# Patient Record
Sex: Female | Born: 1985 | Race: White | Hispanic: No | Marital: Married | Smoking: Never smoker
Health system: Southern US, Community
[De-identification: ages and names within clinical notes are randomized; demographics above are authoritative.]

## PROBLEM LIST (undated history)

## (undated) DIAGNOSIS — F329 Major depressive disorder, single episode, unspecified: Secondary | ICD-10-CM

## (undated) DIAGNOSIS — L709 Acne, unspecified: Secondary | ICD-10-CM

## (undated) DIAGNOSIS — K219 Gastro-esophageal reflux disease without esophagitis: Secondary | ICD-10-CM

## (undated) DIAGNOSIS — F419 Anxiety disorder, unspecified: Secondary | ICD-10-CM

## (undated) DIAGNOSIS — R87629 Unspecified abnormal cytological findings in specimens from vagina: Secondary | ICD-10-CM

## (undated) DIAGNOSIS — A749 Chlamydial infection, unspecified: Secondary | ICD-10-CM

## (undated) DIAGNOSIS — F32A Depression, unspecified: Secondary | ICD-10-CM

## (undated) HISTORY — DX: Chlamydial infection, unspecified: A74.9

## (undated) HISTORY — DX: Acne, unspecified: L70.9

## (undated) HISTORY — DX: Major depressive disorder, single episode, unspecified: F32.9

## (undated) HISTORY — PX: COLPOSCOPY: SHX161

## (undated) HISTORY — DX: Depression, unspecified: F32.A

## (undated) HISTORY — DX: Unspecified abnormal cytological findings in specimens from vagina: R87.629

## (undated) HISTORY — DX: Gastro-esophageal reflux disease without esophagitis: K21.9

## (undated) HISTORY — DX: Anxiety disorder, unspecified: F41.9

## (undated) HISTORY — PX: LEEP: SHX91

---
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
## 2012-12-02 ENCOUNTER — Other Ambulatory Visit: Payer: Self-pay | Admitting: Nurse Practitioner

## 2013-01-10 ENCOUNTER — Other Ambulatory Visit: Payer: Self-pay | Admitting: Nurse Practitioner

## 2013-01-13 ENCOUNTER — Other Ambulatory Visit: Payer: Self-pay | Admitting: Nurse Practitioner

## 2013-01-23 ENCOUNTER — Encounter: Payer: Self-pay | Admitting: *Deleted

## 2013-01-28 ENCOUNTER — Ambulatory Visit (INDEPENDENT_AMBULATORY_CARE_PROVIDER_SITE_OTHER): Payer: 59 | Admitting: Nurse Practitioner

## 2013-01-28 ENCOUNTER — Encounter: Payer: Self-pay | Admitting: Nurse Practitioner

## 2013-01-28 VITALS — BP 130/90 | HR 80 | Ht 58.5 in | Wt 201.0 lb

## 2013-01-28 DIAGNOSIS — F418 Other specified anxiety disorders: Secondary | ICD-10-CM

## 2013-01-28 DIAGNOSIS — Z0000 Encounter for general adult medical examination without abnormal findings: Secondary | ICD-10-CM

## 2013-01-28 DIAGNOSIS — Z01419 Encounter for gynecological examination (general) (routine) without abnormal findings: Secondary | ICD-10-CM

## 2013-01-28 DIAGNOSIS — F341 Dysthymic disorder: Secondary | ICD-10-CM

## 2013-01-28 MED ORDER — VENLAFAXINE HCL ER 150 MG PO CP24: 150.0000 mg | ORAL_CAPSULE | Freq: Every day | ORAL | Status: DC

## 2013-01-28 MED ORDER — OMEPRAZOLE 20 MG PO CPDR: 20.0000 mg | DELAYED_RELEASE_CAPSULE | Freq: Every day | ORAL | Status: AC

## 2013-01-28 NOTE — Progress Notes (Signed)
  Subjective:    Patient ID: Kerry Ryan, female    DOB: 1985-11-16, 27 y.o.   MRN: [id number]  HPI presents for her wellness checkup. Is off her doxycycline. Has been off birth control since December. No regular eye exams. Does get regular dental exams. Has switched to a vegetarian diet but notes that her diet has increased in fat and carbs. Has a very active job. Is considering pregnancy, questioning whether she can continue Effexor. Has been with the same sexual partner since her last physical.   Review of Systems  Constitutional: Negative for activity change, appetite change and fatigue.  HENT: Negative for ear pain, congestion, sore throat, rhinorrhea and dental problem.   Eyes: Negative for visual disturbance.  Respiratory: Negative for chest tightness, shortness of breath and wheezing.   Cardiovascular: Negative for chest pain.  Gastrointestinal: Negative for nausea, vomiting, abdominal pain, diarrhea, constipation and abdominal distention.  Genitourinary: Negative for dysuria, urgency, frequency, vaginal discharge, difficulty urinating, menstrual problem and pelvic pain.  Neurological: Negative for headaches.  Psychiatric/Behavioral: Positive for sleep disturbance. The patient is not nervous/anxious.        Objective:   Physical Exam  Vitals reviewed. Constitutional: She is oriented to person, place, and time. She appears well-developed. No distress.  HENT:  Right Ear: External ear normal.  Left Ear: External ear normal.  Mouth/Throat: Oropharynx is clear and moist.  Eyes: Conjunctivae and EOM are normal. Pupils are equal, round, and reactive to light.  Neck: Normal range of motion. Neck supple. No tracheal deviation present. No thyromegaly present.  Cardiovascular: Normal rate, regular rhythm and normal heart sounds.  Exam reveals no gallop.   No murmur heard. Pulmonary/Chest: Effort normal and breath sounds normal.  Abdominal: Soft. She exhibits no distension. There is  no tenderness.  Genitourinary: Vagina normal and uterus normal. No vaginal discharge found.  Musculoskeletal: She exhibits no edema.  Lymphadenopathy:    She has no cervical adenopathy.  Neurological: She is alert and oriented to person, place, and time.  Skin: Skin is warm and dry. No rash noted.  Psychiatric: She has a normal mood and affect. Her behavior is normal.   Breast exam no masses noted. Axilla no adenopathy. External GU normal. Vagina no discharge. No CMT. Bimanual exam normal limit. GC and Chlamydia on 09/19/12 was negative. Skin a few erythematous lesions on the face.        Assessment & Plan:  Well woman exam  Encouraged weight loss of about 20 pounds. Regular activity. Healthy diet. Daily multivitamin. An e-mail was sent to Cyril Mourning at [facility] regarding use of Effexor during pregnancy. Definitely stop doxycycline if she plans to become pregnant. Next physical in one year.

## 2013-01-29 ENCOUNTER — Encounter: Payer: Self-pay | Admitting: Nurse Practitioner

## 2013-01-29 DIAGNOSIS — F418 Other specified anxiety disorders: Secondary | ICD-10-CM | POA: Insufficient documentation

## 2013-01-29 NOTE — Assessment & Plan Note (Signed)
Continue Effexor as directed. Will consult with gynecology regarding use of Effexor with pregnancy.

## 2013-01-29 NOTE — Assessment & Plan Note (Signed)
Weight loss goal is 20 pounds or 10% of her body weight. Discussed programs available to help her with this.

## 2013-06-02 ENCOUNTER — Ambulatory Visit (HOSPITAL_COMMUNITY)
Admission: RE | Admit: 2013-06-02 | Discharge: 2013-06-02 | Disposition: A | Discharge status: Home / Self Care | Payer: 59 | Source: Ambulatory Visit | Attending: Nurse Practitioner | Admitting: Nurse Practitioner

## 2013-06-02 ENCOUNTER — Ambulatory Visit (INDEPENDENT_AMBULATORY_CARE_PROVIDER_SITE_OTHER): Payer: 59 | Admitting: Nurse Practitioner

## 2013-06-02 ENCOUNTER — Encounter: Payer: Self-pay | Admitting: Nurse Practitioner

## 2013-06-02 VITALS — BP 124/90 | Ht 58.5 in | Wt 199.1 lb

## 2013-06-02 DIAGNOSIS — M25569 Pain in unspecified knee: Secondary | ICD-10-CM | POA: Insufficient documentation

## 2013-06-02 DIAGNOSIS — M25562 Pain in left knee: Secondary | ICD-10-CM

## 2013-06-02 DIAGNOSIS — X58XXXA Exposure to other specified factors, initial encounter: Secondary | ICD-10-CM | POA: Insufficient documentation

## 2013-06-02 DIAGNOSIS — I998 Other disorder of circulatory system: Secondary | ICD-10-CM

## 2013-06-02 DIAGNOSIS — R58 Hemorrhage, not elsewhere classified: Secondary | ICD-10-CM

## 2013-06-02 MED ORDER — VENLAFAXINE HCL ER 150 MG PO CP24: 150.0000 mg | ORAL_CAPSULE | Freq: Every day | ORAL | Status: DC

## 2013-06-02 MED ORDER — ALPRAZOLAM 0.5 MG PO TABS: ORAL_TABLET | ORAL | Status: DC

## 2013-06-05 ENCOUNTER — Other Ambulatory Visit: Payer: Self-pay

## 2013-06-06 ENCOUNTER — Encounter: Payer: Self-pay | Admitting: Nurse Practitioner
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
## 2013-06-06 NOTE — Assessment & Plan Note (Signed)
Restart Effexor as directed. Given Xanax for breakthrough severe anxiety. Recheck in 3 months.

## 2013-06-11 ENCOUNTER — Telehealth: Payer: Self-pay | Admitting: Family Medicine
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
## 2013-06-13 NOTE — Telephone Encounter (Signed)
Letter was done and given to [facility]

## 2013-07-07 ENCOUNTER — Encounter: Payer: Self-pay | Admitting: Family Medicine

## 2013-07-07 ENCOUNTER — Ambulatory Visit (INDEPENDENT_AMBULATORY_CARE_PROVIDER_SITE_OTHER): Payer: 59 | Admitting: Family Medicine

## 2013-07-07 VITALS — BP 128/90 | Temp 97.7°F | Ht 59.5 in | Wt 203.0 lb

## 2013-07-07 DIAGNOSIS — A088 Other specified intestinal infections: Secondary | ICD-10-CM

## 2013-07-07 DIAGNOSIS — A084 Viral intestinal infection, unspecified: Secondary | ICD-10-CM

## 2013-07-07 MED ORDER — ONDANSETRON HCL 8 MG PO TABS: 8.0000 mg | ORAL_TABLET | Freq: Three times a day (TID) | ORAL | Status: DC | PRN

## 2013-07-07 NOTE — Progress Notes (Signed)
   Subjective:    Patient ID: Kerry Ryan, female    DOB: 01/21/1986, 27 y.o.   MRN: [id number]  Abdominal Pain This is a new problem. The current episode started in the past 7 days. Associated symptoms include diarrhea, nausea and vomiting. Associated symptoms comments: dizziness. Treatments tried: OTC nausea med. The treatment provided mild relief.   Patient relates a few days of abdominal cramps discomfort loose stools slight nausea occasional vomiting denies wheezing difficulty breathing cough denies dysuria hematuria or hematochezia. No recent travel.  PMH benign Review of Systems  Gastrointestinal: Positive for nausea, vomiting, abdominal pain and diarrhea.       Objective:   Physical Exam Lungs are clear hearts regular abdomen soft no guarding or rebound extremities no edema skin warm dry       Assessment & Plan:  Viral gastroenteritis Zofran as necessary for nausea warning signs discussed a bloody stools or worse followup

## 2013-07-16 ENCOUNTER — Ambulatory Visit (INDEPENDENT_AMBULATORY_CARE_PROVIDER_SITE_OTHER): Payer: 59 | Admitting: Nurse Practitioner

## 2013-07-16 ENCOUNTER — Encounter: Payer: Self-pay | Admitting: Nurse Practitioner

## 2013-07-16 VITALS — BP 122/82 | Ht <= 58 in | Wt 206.0 lb

## 2013-07-16 DIAGNOSIS — Z7251 High risk heterosexual behavior: Secondary | ICD-10-CM

## 2013-07-16 DIAGNOSIS — Z3009 Encounter for other general counseling and advice on contraception: Secondary | ICD-10-CM

## 2013-07-16 MED ORDER — LEVONORGEST-ETH ESTRAD 91-DAY 0.15-0.03 &0.01 MG PO TABS: 1.0000 | ORAL_TABLET | Freq: Every day | ORAL | Status: DC

## 2013-07-19 ENCOUNTER — Encounter: Payer: Self-pay | Admitting: Nurse Practitioner

## 2013-07-19 LAB — GC/CHLAMYDIA PROBE AMP, URINE: GC Probe Amp, Urine: NEGATIVE

## 2013-07-19 NOTE — Progress Notes (Signed)
Subjective:  Presents to discuss restarting her birth control pills. Has had irregular bleeding and spotting since her miscarriage a few months ago. No vaginal discharge fever or pelvic pain. Has not had gonorrhea or Chlamydia testing of the past few months with her previous sexual partner.  Objective:   BP 122/82  Ht 4\' 10"  (1.473 m)  Wt 206 lb (93.441 kg)  BMI 43.07 kg/m2 NAD. Alert, oriented. Lungs clear. Heart regular rate rhythm.  Assessment:Other general counseling and advice for contraceptive management  High risk sexual behavior - Plan: GC/chlamydia probe amp, urine  Plan: Meds ordered this encounter  Medications  . Levonorgestrel-Ethinyl Estradiol (AMETHIA,CAMRESE) 0.15-0.03 &0.01 MG tablet    Sig: Take 1 tablet by mouth daily.    Dispense:  1 Package    Refill:  4    Order Specific Question:  Supervising Provider    Answer:  Riccardo Dubin   Reviewed safe sex issues. Start birth control pills first Sunday after her next cycle begins. Use backup method first pack. Call back if any problems.

## 2013-07-22 NOTE — Progress Notes (Signed)
Patient notified

## 2013-09-12 ENCOUNTER — Encounter: Payer: Self-pay | Admitting: Nurse Practitioner

## 2013-09-12 ENCOUNTER — Ambulatory Visit (INDEPENDENT_AMBULATORY_CARE_PROVIDER_SITE_OTHER): Payer: 59 | Admitting: Nurse Practitioner

## 2013-09-12 VITALS — BP 136/96 | Ht 59.5 in | Wt 200.0 lb

## 2013-09-12 DIAGNOSIS — N949 Unspecified condition associated with female genital organs and menstrual cycle: Secondary | ICD-10-CM

## 2013-09-12 DIAGNOSIS — N938 Other specified abnormal uterine and vaginal bleeding: Secondary | ICD-10-CM

## 2013-09-12 DIAGNOSIS — N925 Other specified irregular menstruation: Secondary | ICD-10-CM

## 2013-09-12 MED ORDER — PHENTERMINE HCL 37.5 MG PO TABS: 37.5000 mg | ORAL_TABLET | Freq: Every day | ORAL | Status: DC

## 2013-09-12 MED ORDER — DROSPIRENONE-ETHINYL ESTRADIOL 3-0.03 MG PO TABS: 1.0000 | ORAL_TABLET | Freq: Every day | ORAL | Status: DC

## 2013-09-12 NOTE — Patient Instructions (Signed)
cetaphil cleanser

## 2013-09-18 ENCOUNTER — Encounter: Payer: Self-pay | Admitting: Nurse Practitioner

## 2013-09-18 NOTE — Progress Notes (Signed)
Subjective:  Presents for complaints of breakthrough bleeding for the past 2 weeks on her current birth control pill. Light flow. Off-and-on bleeding. Currently on week 8 of her pack. Would also like to restart her phentermine for weight loss, has been off of this for a long time. Stress level has improved, is now back in her own home. Is not currently sexually active. No new partners.  Objective:   BP 136/96  Ht 4' 11.5" (1.511 m)  Wt 200 lb (90.719 kg)  BMI 39.73 kg/m2 NAD. Alert, oriented. Cheerful affect. Lungs clear. Heart regular rate rhythm. No murmur or gallop noted. BP on recheck right arm sitting 136/96.  Assessment: Problem List Items Addressed This Visit     Other   Morbid obesity (Chronic)   Relevant Medications      phentermine (ADIPEX-P) 37.5 MG tablet    Other Visit Diagnoses   DUB (dysfunctional uterine bleeding)    -  Primary      Plan:  Meds ordered this encounter  Medications  . drospirenone-ethinyl estradiol (YASMIN,ZARAH,SYEDA) 3-0.03 MG tablet    Sig: Take 1 tablet by mouth daily.    Dispense:  1 Package    Refill:  11    Order Specific Question:  Supervising Provider    Answer:  Merlyn AlbertLUKING, WILLIAM S [[id number]]  . phentermine (ADIPEX-P) 37.5 MG tablet    Sig: Take 1 tablet (37.5 mg total) by mouth daily before breakfast.    Dispense:  30 tablet    Refill:  2    Order Specific Question:  Supervising Provider    Answer:  Merlyn AlbertLUKING, WILLIAM S [[id number]]   Recheck BP outside office which she has restarted her phentermine. DC med and call if it remains above 140/90. Start Yasmin this coming Sunday. Call back after one week if bleeding persists. Return in about 3 months (around 12/10/2013).

## 2013-10-22 ENCOUNTER — Ambulatory Visit (INDEPENDENT_AMBULATORY_CARE_PROVIDER_SITE_OTHER): Payer: 59 | Admitting: Nurse Practitioner

## 2013-10-22 ENCOUNTER — Encounter: Payer: Self-pay | Admitting: Nurse Practitioner

## 2013-10-22 VITALS — BP 132/92 | Ht 59.5 in | Wt 195.0 lb

## 2013-10-22 DIAGNOSIS — J309 Allergic rhinitis, unspecified: Secondary | ICD-10-CM

## 2013-10-22 DIAGNOSIS — J300 Vasomotor rhinitis: Secondary | ICD-10-CM

## 2013-10-22 NOTE — Patient Instructions (Signed)
OTC antihistamine Nasacort AQ as directed 

## 2013-10-26 ENCOUNTER — Encounter: Payer: Self-pay | Admitting: Nurse Practitioner

## 2013-10-26 NOTE — Progress Notes (Signed)
Subjective:  Presents with complaints of sinus congestion over the past 2 days. No fever. Nonproductive cough. Runny nose. No wheezing. Off-and-on maxillary area pressure. Dry irritated throat. Slight ear pain mainly in the morning.  Objective:   BP 132/92  Ht 4' 11.5" (1.511 m)  Wt 195 lb (88.451 kg)  BMI 38.74 kg/m2 NAD. Alert, oriented. TMs clear effusion, no erythema. Nasal mucosa pale and boggy. Pharynx injected. Neck supple with mild soft anterior adenopathy. Lungs clear. Heart regular rate rhythm.  Assessment:Vasomotor rhinitis  Plan: OTC antihistamines and Nasacort AQ as directed. Warning signs reviewed. Call back if worsens or persists.

## 2013-11-18 ENCOUNTER — Other Ambulatory Visit: Payer: Self-pay | Admitting: Nurse Practitioner

## 2013-11-21 ENCOUNTER — Other Ambulatory Visit: Payer: Self-pay | Admitting: *Deleted

## 2013-11-21 MED ORDER — ALPRAZOLAM 0.5 MG PO TABS: ORAL_TABLET | ORAL | Status: DC

## 2013-12-16 ENCOUNTER — Other Ambulatory Visit: Payer: Self-pay | Admitting: Nurse Practitioner

## 2014-01-27 ENCOUNTER — Ambulatory Visit: Payer: 59 | Admitting: Nurse Practitioner

## 2014-01-28 ENCOUNTER — Other Ambulatory Visit: Payer: Self-pay | Admitting: Nurse Practitioner

## 2014-01-28 NOTE — Telephone Encounter (Signed)
Seen 3/25

## 2014-02-09 ENCOUNTER — Ambulatory Visit (INDEPENDENT_AMBULATORY_CARE_PROVIDER_SITE_OTHER): Payer: 59 | Admitting: Nurse Practitioner

## 2014-02-09 ENCOUNTER — Encounter: Payer: Self-pay | Admitting: Nurse Practitioner

## 2014-02-09 DIAGNOSIS — L909 Atrophic disorder of skin, unspecified: Secondary | ICD-10-CM

## 2014-02-09 DIAGNOSIS — L918 Other hypertrophic disorders of the skin: Secondary | ICD-10-CM

## 2014-02-09 DIAGNOSIS — L919 Hypertrophic disorder of the skin, unspecified: Secondary | ICD-10-CM

## 2014-02-09 MED ORDER — PHENTERMINE HCL 37.5 MG PO TABS: 37.5000 mg | ORAL_TABLET | Freq: Every day | ORAL | Status: DC

## 2014-02-09 NOTE — Patient Instructions (Addendum)
[place]  Vertical sleeve gastrectomy L tryptophan

## 2014-02-12 ENCOUNTER — Encounter: Payer: Self-pay | Admitting: Nurse Practitioner

## 2014-02-12 NOTE — Progress Notes (Signed)
Subjective:  Presents for followup on weight loss medicine. Doing better with her exercise activity. Continues to seek psychiatrist. Depression and anxiety are improving. Also has a skin tag on the left side of her neck that is been there for a while. Slight color change. No bleeding.  Objective:   BP 130/90  Ht 4' 11.5" (1.511 m)  Wt 192 lb (87.091 kg)  BMI 38.15 kg/m2 NAD. Alert, oriented. Lungs clear. Heart regular rhythm. A small dark skin tag noted on the left neck area.  Assessment: Morbid obesity  Skin tag  Plan:  Meds ordered this encounter  Medications  . phentermine (ADIPEX-P) 37.5 MG tablet    Sig: Take 1 tablet (37.5 mg total) by mouth daily before breakfast.    Dispense:  30 tablet    Refill:  2    Order Specific Question:  Supervising Provider    Answer:  Merlyn AlbertLUKING, WILLIAM S [[id number]]   Continue healthy diet exercise and weight loss efforts. Discussed other options including bariatric surgery. Recommend office visit for skin tag removal.

## 2014-02-26 ENCOUNTER — Encounter: Payer: Self-pay | Admitting: Nurse Practitioner

## 2014-02-26 ENCOUNTER — Ambulatory Visit (INDEPENDENT_AMBULATORY_CARE_PROVIDER_SITE_OTHER): Payer: 59 | Admitting: Nurse Practitioner

## 2014-02-26 VITALS — BP 124/88 | Ht 59.5 in | Wt 191.0 lb

## 2014-02-26 DIAGNOSIS — L919 Hypertrophic disorder of the skin, unspecified: Secondary | ICD-10-CM

## 2014-02-26 DIAGNOSIS — L909 Atrophic disorder of skin, unspecified: Secondary | ICD-10-CM

## 2014-02-26 DIAGNOSIS — L918 Other hypertrophic disorders of the skin: Secondary | ICD-10-CM

## 2014-03-03 ENCOUNTER — Encounter: Payer: Self-pay | Admitting: Nurse Practitioner

## 2014-03-03 NOTE — Progress Notes (Signed)
Subjective:  Presents for removal of skin tag on the left side of her neck.  Objective:   BP 124/88  Ht 4' 11.5" (1.511 m)  Wt 191 lb (86.637 kg)  BMI 37.95 kg/m2 Area at base of skin tag injected with a small amount of lidocaine with epi. Skin tag removed without difficulty. Minimal bleeding.  Assessment:Skin tag  Plan: Keep area clean and dry. Expect gradual healing. Call back if any problems.

## 2014-03-05 ENCOUNTER — Other Ambulatory Visit: Payer: Self-pay | Admitting: *Deleted

## 2014-03-05 MED ORDER — DROSPIRENONE-ETHINYL ESTRADIOL 3-0.03 MG PO TABS: 1.0000 | ORAL_TABLET | Freq: Every day | ORAL | Status: DC

## 2014-03-05 MED ORDER — VENLAFAXINE HCL ER 150 MG PO CP24: ORAL_CAPSULE | ORAL | Status: DC

## 2014-04-22 ENCOUNTER — Other Ambulatory Visit: Payer: Self-pay | Admitting: Family Medicine

## 2014-04-22 NOTE — Telephone Encounter (Signed)
May refill this +3 additional refills 

## 2014-09-22 ENCOUNTER — Other Ambulatory Visit: Payer: Self-pay | Admitting: Nurse Practitioner

## 2014-10-19 ENCOUNTER — Other Ambulatory Visit: Payer: Self-pay | Admitting: Family Medicine

## 2015-01-11 ENCOUNTER — Other Ambulatory Visit: Payer: Self-pay | Admitting: Nurse Practitioner

## 2015-01-11 NOTE — Telephone Encounter (Signed)
Needs office visit.

## 2015-02-08 ENCOUNTER — Other Ambulatory Visit: Payer: Self-pay | Admitting: Nurse Practitioner

## 2015-02-08 NOTE — Telephone Encounter (Signed)
Needs office visit.

## 2015-03-08 ENCOUNTER — Other Ambulatory Visit: Payer: Self-pay | Admitting: Nurse Practitioner

## 2015-06-07 ENCOUNTER — Encounter: Payer: Self-pay | Admitting: Nurse Practitioner

## 2015-06-15 ENCOUNTER — Other Ambulatory Visit (HOSPITAL_COMMUNITY)
Admission: RE | Admit: 2015-06-15 | Discharge: 2015-06-15 | Disposition: A | Discharge status: Home / Self Care | Payer: Self-pay | Source: Ambulatory Visit | Attending: Unknown Physician Specialty | Admitting: Unknown Physician Specialty

## 2015-06-15 DIAGNOSIS — R87619 Unspecified abnormal cytological findings in specimens from cervix uteri: Secondary | ICD-10-CM | POA: Insufficient documentation

## 2015-08-09 ENCOUNTER — Encounter: Payer: Self-pay | Admitting: *Deleted

## 2015-09-08 ENCOUNTER — Encounter: Payer: Self-pay | Admitting: Obstetrics and Gynecology

## 2015-09-08 ENCOUNTER — Ambulatory Visit (INDEPENDENT_AMBULATORY_CARE_PROVIDER_SITE_OTHER): Payer: BLUE CROSS/BLUE SHIELD | Admitting: Obstetrics and Gynecology

## 2015-09-08 ENCOUNTER — Other Ambulatory Visit: Payer: Self-pay | Admitting: Obstetrics and Gynecology

## 2015-09-08 VITALS — BP 140/100 | HR 78 | Ht <= 58 in | Wt 231.0 lb

## 2015-09-08 DIAGNOSIS — Z3202 Encounter for pregnancy test, result negative: Secondary | ICD-10-CM | POA: Diagnosis not present

## 2015-09-08 DIAGNOSIS — N871 Moderate cervical dysplasia: Secondary | ICD-10-CM

## 2015-09-08 LAB — POCT URINE PREGNANCY: PREG TEST UR: NEGATIVE

## 2015-09-08 MED ORDER — METRONIDAZOLE 0.75 % VA GEL: 1.0000 | Freq: Every day | VAGINAL | Status: DC

## 2015-09-08 NOTE — Addendum Note (Signed)
Addended by: Tilda Burrow on: 09/08/2015 01:09 PM   Modules accepted: Orders

## 2015-09-08 NOTE — Progress Notes (Signed)
Patient ID: Kerry Ryan, female   DOB: 1986/03/14, 30 y.o.   MRN: [id number] [facility] PROCEDURE NOTE:  LEEP Kerry Ryan is a 30 y.o. No obstetric history on file. here for LEEP. No GYN concerns. Pap smear and colposcopy reviewed.  Pt interviewed, is on OCP, has neg preg test. Rationale for procedure reviewed. With pt and partner.    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  Role of HPV,cervical dysplasia and need for close followup was empasized. Informed written consent was obtained. All questions were answered. Time out performed.  ??Procedure: The patient was placed in lithotomy position and the bivalved coated speculum was placed in the patient's vagina. A grounding pad placed on the patient. Lugol's solution was applied to the cervix and areas of decreased uptake were noted around the transformation zone.   Local anesthesia was administered via an intracervical block using 20cc of 1% Lidocaine without epinephrine. The suction was turned on and the 1X Fisher Cone Biopsy Excisor on 50 Watts of cutting current was used to excise the area of decreased uptake and excise the entire transformation zone. Excellent hemostasis was achieved using roller ball coagulation set at 50 Watts coagulation current. Monsel's solution was then applied and the specuCone lum was removed from the vagina. Specimens were sent to pathology.  ?The patient tolerated the procedure well. Post-operative instructions given to patient, including instruction to seek medical attention for persistent bright red bleeding, fever, abdominal/pelvic pain, dysuria, nausea or vomiting. She was also told about the  possibility of having copious yellow to black tinged discharge for weeks. She was counseled to avoid anything in the vagina (sex/douching/tampons) for 3 weeks. She has a 4 week post-operative check to assess wound healing, review results and discuss further management.   By signing my name below, I, Marica Otter, attest that this documentation has been prepared under the direction and in the presence of Christin Bach, MD. Electronically Signed: Marica Otter, ED Scribe. 09/08/2015. 9:51 AM.   I personally performed the services described in this documentation, which was SCRIBED in my presence. The recorded information has been reviewed and considered accurate. It has been edited as necessary during review. Tilda Burrow, MD

## 2015-09-08 NOTE — Patient Instructions (Signed)
Conization of the Cervix  Cervical conization is the cutting (excision) of a cone-shaped portion of the cervix. The procedure is performed through the vagina in either your health care provider's office or an operating room. This procedure is usually done when there is abnormal bleeding from the cervix. It can also be done to evaluate an abnormal Pap test or if an abnormality is seen on the cervix during an exam. The tissue is then examined to see if there are precancerous cells or cancer present.   Conization of the cervix is not done during a menstrual period or pregnancy.   LET YOUR HEALTH CARE PROVIDER KNOW ABOUT:  · Any allergies you have.    · All medicines you are taking, including vitamins, herbs, eye drops, creams, and over-the-counter medicines.    · Previous problems you or members of your family have had with the use of anesthetics.    · Any blood disorders you have.    · Previous surgeries you have had.    · Medical conditions you have.    · Your smoking habits.    · The possibility of being pregnant.    RISKS AND COMPLICATIONS   Generally, conization of the cervix is a safe procedure. However, as with any procedure, complications can occur. Possible complications include:  · Heavy bleeding several days or weeks after the procedure. Light bleeding or spotting after the procedure is normal.  · Infection (rare).  · Damage to the cervix or surrounding organs (uncommon).    · Problems with the anesthesia.    · Increased risk of preterm labor in future pregnancies.  BEFORE THE PROCEDURE  · Do not eat or drink anything for 6-8 hours before the procedure.    · Do not take aspirin or blood thinners for at least a week before the procedure or as directed by your health care provider.    · Arrange for someone to take you home after the procedure.    PROCEDURE  There are three different methods to perform conization of the cervix. These include:   · The cold knife method. In this method a small cone-shaped sample  of tissue is cut out with a knife (scalpel) from the cervical canal and the transformation zone (where the normal cells end and the abnormal cells begin).    · The LEEP method. In this method a small cone-shaped sample of tissue is cut out with a thin wire that can burn (cauterize) the cervical tissue with an electrical current.    · Laser treatment. In this method a small cone-shaped sample of tissue is cut out and then cauterized with a laser beam to prevent bleeding.    The procedure will be performed as follows:   · Depending on the method, you will either be given a medicine to make you sleep (general anesthetic) or a numbing medicine (local anesthetic). A medicine that numbs the cervix (cervical block) may be given.    · A lubricated device called a speculum will be inserted into the vagina to spread open the walls of the vagina. This will help your health care provider see the inside of the vagina and cervix better.    · The tissue from the cervix will be removed and examined.    · The results of the procedure will help your health care provider decide if further treatment is necessary. They will also help your health care provider decide on the best treatment if your results are abnormal.  AFTER THE PROCEDURE  · If you had a general anesthetic, you may be groggy for 2-3 hours after   to 3 weeks.   You may have some cramping for about 1 week.   You may have bloody discharge or light bleeding for 1-2 weeks.   You may have black discharge coming from the vagina. This is from the paste used on the cervix to prevent bleeding. This is normal discharge.    This information is not intended to replace advice given to you by your health care provider. Make sure you discuss any questions you have with your health care provider.   Document  Released: 04/26/2005 Document Revised: 07/22/2013 Document Reviewed: 01/10/2013 Elsevier Interactive Patient Education 2016 [employer] Loop Electrosurgical Excision Procedure Loop electrosurgical excision procedure (LEEP) is the removal of a portion of the lower part of the uterus (cervix). The procedure is done when there are significantly abnormal cervical cell changes. Abnormal cell changes of the cervix can lead to cancer if left in place and untreated.  The LEEP procedure itself typically only takes a few minutes. Often, it may be done in your caregiver's office. The procedure is considered safe for those who wish to get pregnant or are trying to get pregnant. Only under rare circumstances should this procedure be done if you are pregnant. LET YOUR CAREGIVER KNOW ABOUT:  Whether you are pregnant or late for your last menstrual period.  Allergies to foods or medicines.  All the medicines you are taking includingherbs, eyedrops, and over-the-counter medicines, and creams.  Use of steroids (by mouth or creams).  Previous problems with anesthetics or numbing medicine.  Previous gynecological surgery.  History of blood clots or bleeding problems.  Any recent or current vaginal infections (herpes, sexually transmitted infections).  Other health problems. RISKS AND COMPLICATIONS  Bleeding.  Infection.  Injury to the vagina, bladder, or rectum.  Very rare obstruction of the cervical opening that causes problems during menstruation (cervical stenosis). BEFORE THE PROCEDURE  Do not take aspirin or blood thinners (anticoagulants) for 1 week before the procedure, or as told by your caregiver.  Eat a light meal before the procedure.  Ask your caregiver about changing or stopping your regular medicines.  You may be given a pain reliever 1 or 2 hours before the procedure. PROCEDURE   A tool (speculum) is placed in the vagina. This allows your caregiver to see the  cervix.  An iodine stain is applied to the cervix to find the area of abnormal cells to be removed.  Medicine is injected to numb the cervix (local anesthetic).   Electricity is passed through a thin wire loop which is then used to remove (cauterize) a small segment of the affected cervix.  Light electrocautery is used to seal any small blood vessels and prevent bleeding.  A paste may be applied to the cauterized area of the cervix to help prevent bleeding.  The tissue sample is sent to the lab. It is examined under the microscope. AFTER THE PROCEDURE  Have someone drive you home.  You may have slight to moderate cramping.  You may notice a black vaginal discharge from the paste used on the cervix to prevent bleeding. This is normal.  Watch for excessive bleeding. This requires immediate medical care.  Ask when your test results will be ready. Make sure you get your test results.   This information is not intended to replace advice given to you by your health care provider. Make sure you discuss any questions you have with your health care provider.   Document Released: 10/07/2002 Document Revised: 10/09/2011 Document Reviewed: 12/27/2010  Chartered certified accountant Patient Education [employer].

## 2015-09-11 ENCOUNTER — Emergency Department (HOSPITAL_COMMUNITY): Payer: BLUE CROSS/BLUE SHIELD

## 2015-09-11 ENCOUNTER — Emergency Department (HOSPITAL_COMMUNITY)
Admission: EM | Admit: 2015-09-11 | Discharge: 2015-09-11 | Disposition: A | Discharge status: Home / Self Care | Payer: BLUE CROSS/BLUE SHIELD | Attending: Emergency Medicine | Admitting: Emergency Medicine

## 2015-09-11 ENCOUNTER — Encounter (HOSPITAL_COMMUNITY): Payer: Self-pay

## 2015-09-11 DIAGNOSIS — J029 Acute pharyngitis, unspecified: Secondary | ICD-10-CM | POA: Diagnosis present

## 2015-09-11 DIAGNOSIS — Z872 Personal history of diseases of the skin and subcutaneous tissue: Secondary | ICD-10-CM | POA: Diagnosis not present

## 2015-09-11 DIAGNOSIS — K219 Gastro-esophageal reflux disease without esophagitis: Secondary | ICD-10-CM | POA: Diagnosis not present

## 2015-09-11 DIAGNOSIS — Z79899 Other long term (current) drug therapy: Secondary | ICD-10-CM | POA: Diagnosis not present

## 2015-09-11 DIAGNOSIS — J069 Acute upper respiratory infection, unspecified: Secondary | ICD-10-CM | POA: Diagnosis not present

## 2015-09-11 DIAGNOSIS — Z8659 Personal history of other mental and behavioral disorders: Secondary | ICD-10-CM | POA: Diagnosis not present

## 2015-09-11 DIAGNOSIS — Z8619 Personal history of other infectious and parasitic diseases: Secondary | ICD-10-CM | POA: Insufficient documentation

## 2015-09-11 LAB — RAPID STREP SCREEN (MED CTR MEBANE ONLY): Streptococcus, Group A Screen (Direct): NEGATIVE

## 2015-09-11 MED ORDER — DM-GUAIFENESIN ER 30-600 MG PO TB12
1.0000 | ORAL_TABLET | Freq: Two times a day (BID) | ORAL | Status: DC
  Administered 2015-09-11: 1 via ORAL
  Filled 2015-09-11: qty 1

## 2015-09-11 NOTE — Discharge Instructions (Signed)
Take mucinex DM OTC for your cough and OTC cough lozenges. . Monitor your temperature. You can take ibuprofen 600 mg and/or acetaminophen 1000 mg 4 times a day for low grade fever, body aches, or chest soreness from coughing.  Recheck if you get a high fever, struggle to breathe, have vomiting or your symptoms aren't improving in the next 7-10 days. Right now your symptoms are viral and antibiotics are not going to help with your symptoms.    Cough, Adult A cough helps to clear your throat and lungs. A cough may last only 2-3 weeks (acute), or it may last longer than 8 weeks (chronic). Many different things can cause a cough. A cough may be a sign of an illness or another medical condition. HOME CARE  Pay attention to any changes in your cough.  Take medicines only as told by your doctor.  If you were prescribed an antibiotic medicine, take it as told by your doctor. Do not stop taking it even if you start to feel better.  Talk with your doctor before you try using a cough medicine.  Drink enough fluid to keep your pee (urine) clear or pale yellow.  If the air is dry, use a cold steam vaporizer or humidifier in your home.  Stay away from things that make you cough at work or at home.  If your cough is worse at night, try using extra pillows to raise your head up higher while you sleep.  Do not smoke, and try not to be around smoke. If you need help quitting, ask your doctor.  Do not have caffeine.  Do not drink alcohol.  Rest as needed. GET HELP IF:  You have new problems (symptoms).  You cough up yellow fluid (pus).  Your cough does not get better after 2-3 weeks, or your cough gets worse.  Medicine does not help your cough and you are not sleeping well.  You have pain that gets worse or pain that is not helped with medicine.  You have a fever.  You are losing weight and you do not know why.  You have night sweats. GET HELP RIGHT AWAY IF:  You cough up blood.  You  have trouble breathing.  Your heartbeat is very fast.   This information is not intended to replace advice given to you by your health care provider. Make sure you discuss any questions you have with your health care provider.   Document Released: 03/30/2011 Document Revised: 04/07/2015 Document Reviewed: 09/23/2014 Elsevier Interactive Patient Education 2016 Elsevier Inc.  Upper Respiratory Infection, Adult Most upper respiratory infections (URIs) are caused by a virus. A URI affects the nose, throat, and upper air passages. The most common type of URI is often called "the common cold." HOME CARE   Take medicines only as told by your doctor.  Gargle warm saltwater or take cough drops to comfort your throat as told by your doctor.  Use a warm mist humidifier or inhale steam from a shower to increase air moisture. This may make it easier to breathe.  Drink enough fluid to keep your pee (urine) clear or pale yellow.  Eat soups and other clear broths.  Have a healthy diet.  Rest as needed.  Go back to work when your fever is gone or your doctor says it is okay.  You may need to stay home longer to avoid giving your URI to others.  You can also wear a face mask and wash your hands often  to prevent spread of the virus.  Use your inhaler more if you have asthma.  Do not use any tobacco products, including cigarettes, chewing tobacco, or electronic cigarettes. If you need help quitting, ask your doctor. GET HELP IF:  You are getting worse, not better.  Your symptoms are not helped by medicine.  You have chills.  You are getting more short of breath.  You have brown or red mucus.  You have yellow or brown discharge from your nose.  You have pain in your face, especially when you bend forward.  You have a fever.  You have puffy (swollen) neck glands.  You have pain while swallowing.  You have white areas in the back of your throat. GET HELP RIGHT AWAY IF:   You  have very bad or constant:  Headache.  Ear pain.  Pain in your forehead, behind your eyes, and over your cheekbones (sinus pain).  Chest pain.  You have long-lasting (chronic) lung disease and any of the following:  Wheezing.  Long-lasting cough.  Coughing up blood.  A change in your usual mucus.  You have a stiff neck.  You have changes in your:  Vision.  Hearing.  Thinking.  Mood. MAKE SURE YOU:   Understand these instructions.  Will watch your condition.  Will get help right away if you are not doing well or get worse.   This information is not intended to replace advice given to you by your health care provider. Make sure you discuss any questions you have with your health care provider.   Document Released: 01/03/2008 Document Revised: 12/01/2014 Document Reviewed: 10/22/2013 Elsevier Interactive Patient Education 2016 Elsevier Inc.  Viral Infections A virus is a type of germ. Viruses can cause:  Minor sore throats.  Aches and pains.  Headaches.  Runny nose.  Rashes.  Watery eyes.  Tiredness.  Coughs.  Loss of appetite.  Feeling sick to your stomach (nausea).  Throwing up (vomiting).  Watery poop (diarrhea). HOME CARE   Only take medicines as told by your doctor.  Drink enough water and fluids to keep your pee (urine) clear or pale yellow. Sports drinks are a good choice.  Get plenty of rest and eat healthy. Soups and broths with crackers or rice are fine. GET HELP RIGHT AWAY IF:   You have a very bad headache.  You have shortness of breath.  You have chest pain or neck pain.  You have an unusual rash.  You cannot stop throwing up.  You have watery poop that does not stop.  You cannot keep fluids down.  You or your child has a temperature by mouth above 102 F (38.9 C), not controlled by medicine.  Your baby is older than 3 months with a rectal temperature of 102 F (38.9 C) or higher.  Your baby is 40 months old  or younger with a rectal temperature of 100.4 F (38 C) or higher. MAKE SURE YOU:   Understand these instructions.  Will watch this condition.  Will get help right away if you are not doing well or get worse.   This information is not intended to replace advice given to you by your health care provider. Make sure you discuss any questions you have with your health care provider.   Document Released: 06/29/2008 Document Revised: 10/09/2011 Document Reviewed: 12/23/2014 Elsevier Interactive Patient Education [employer]

## 2015-09-11 NOTE — ED Notes (Signed)
Pt reports productive cough with yellow phlegm, sore throat, fevers at home, diarrhea, no vomiting

## 2015-09-11 NOTE — ED Notes (Signed)
MD at bedside. 

## 2015-09-11 NOTE — ED Provider Notes (Signed)
CSN: [id number]     Arrival date & time 09/11/15  0515 History   First MD Initiated Contact with Patient 09/11/15 0550     Chief Complaint  Patient presents with  . Sore Throat  . Cough     (Consider location/radiation/quality/duration/timing/severity/associated sxs/prior Treatment) HPI  Patient states she started getting a cough on February 8. She had a LEEP procedure done at her GYN office. She states her cough has been dry. She states today it got worse. She states she blew her nose today and she has some yellow nasal drainage although it had been clear prior to today. She states she felt like her ears were draining and she now has bilateral ear pain, she also states she has a sore throat. She states at night she's been feeling hot without feeling cold or having chills. She states her anterior chest hurts when she coughs. She denies nausea, vomiting, but has had loose stools 1-2 times a day instead of normal stools. She has some mild headaches. 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  PCP Dr Gerda Diss  Past Medical History  Diagnosis Date  . Depression   . Anxiety   . GERD (gastroesophageal reflux disease)   . Acne   . Chlamydia infection    Past Surgical History  Procedure Laterality Date  . No past surgeries     Family History  Problem Relation Age of Onset  . Hypertension Mother   . Sarcoidosis Mother   . Cancer Paternal Grandmother     breast  . Other Paternal Grandmother     gangrene   Social History  Substance Use Topics  . Smoking status: Never Smoker   . Smokeless tobacco: None  . Alcohol Use: No   Employed at [employer] as Conservation officer, nature  OB History    No data available     Review of Systems  All other systems reviewed and are negative.     Allergies  Review of patient's allergies indicates no known allergies.  Home Medications   Prior to Admission medications    Medication Sig Start Date End Date Taking? Authorizing Provider  acetaminophen (TYLENOL) 500 MG tablet Take 1,000 mg by mouth 2 (two) times daily as needed. Reported on 09/08/2015   Yes Historical Provider, MD  cetirizine (ZYRTEC) 10 MG tablet Take 10 mg by mouth daily.   Yes Historical Provider, MD  guaiFENesin (MUCINEX) 600 MG 12 hr tablet Take by mouth 2 (two) times daily.   Yes Historical Provider, MD  ibuprofen (ADVIL,MOTRIN) 200 MG tablet Take 200 mg by mouth 2 (two) times daily as needed.   Yes Historical Provider, MD  metroNIDAZOLE (METROGEL) 0.75 % vaginal gel Place 1 Applicatorful vaginally at bedtime. Apply one applicatorful to vagina at bedtime 2-3 times weekly beginning 1 wk after procedure. 09/08/15  Yes Tilda Burrow, MD  norethindrone (ORTHO MICRONOR) 0.35 MG tablet Take 1 tablet by mouth daily.   Yes Historical Provider, MD  omeprazole (PRILOSEC) 20 MG capsule Take 1 capsule (20 mg total) by mouth daily. 01/28/13  Yes Campbell Riches, NP   BP 125/86 mmHg  Pulse 89  Temp(Src) 98.4 F (36.9 C) (Oral)  Resp 18  Ht  (1.473 m)  Wt 220 lb (99.791 kg)  BMI 45.99 kg/m2  SpO2 98%  LMP 08/31/2015  Vital signs normal   Physical Exam  Constitutional: She is oriented to  person, place, and time. She appears well-developed and well-nourished.  Non-toxic appearance. She does not appear ill. No distress.  HENT:  Head: Normocephalic and atraumatic.  Right Ear: Hearing, tympanic membrane, external ear and ear canal normal.  Left Ear: Hearing, tympanic membrane, external ear and ear canal normal.  Nose: Nose normal. No mucosal edema or rhinorrhea.  Mouth/Throat: Oropharynx is clear and moist and mucous membranes are normal. No dental abscesses or uvula swelling.  Eyes: Conjunctivae and EOM are normal. Pupils are equal, round, and reactive to light.  Neck: Normal range of motion and full passive range of motion without pain. Neck supple.  Cardiovascular: Normal rate, regular rhythm  and normal heart sounds.  Exam reveals no gallop and no friction rub.   No murmur heard. Pulmonary/Chest: Effort normal and breath sounds normal. No respiratory distress. She has no wheezes. She has no rhonchi. She has no rales. She exhibits no tenderness and no crepitus.  coughing  Abdominal: Soft. Normal appearance and bowel sounds are normal. She exhibits no distension. There is no tenderness. There is no rebound and no guarding.  Musculoskeletal: Normal range of motion. She exhibits no edema or tenderness.  Moves all extremities well.   Neurological: She is alert and oriented to person, place, and time. She has normal strength. No cranial nerve deficit.  Skin: Skin is warm, dry and intact. No rash noted. No erythema. No pallor.  Psychiatric: She has a normal mood and affect. Her speech is normal and behavior is normal. Her mood appears not anxious.  Nursing note and vitals reviewed.   ED Course  Procedures (including critical care time)  Medications  dextromethorphan-guaiFENesin (MUCINEX DM) 30-600 MG per 12 hr tablet 1 tablet (1 tablet Oral Given 09/11/15 0703)   Patient was given Mucinex DM for her cough. We discussed her lab results and her chest x-ray results. I did also discuss with her symptoms are most likely viral because she's only had the symptoms a few days. She was advised to take over-the-counter cough and sore throat lozenges for comfort and to monitor herself for fever. She should be rechecked if she gets a high fever she seems worse or is not improving in the next week.   Labs Review Results for orders placed or performed during the hospital encounter of 09/11/15  Rapid strep screen  Result Value Ref Range   Streptococcus, Group A Screen (Direct) NEGATIVE NEGATIVE   Laboratory interpretation all normal     Imaging Review Dg Chest 2 View  09/11/2015  CLINICAL DATA:  Cough with sore throat and fever EXAM: CHEST  2 VIEW COMPARISON:  None. FINDINGS: There is no edema  or consolidation. The heart size and pulmonary vascularity are normal. No adenopathy. No bone lesions. IMPRESSION: No edema or consolidation. Electronically Signed   By: Bretta Bang III M.D.   On: 09/11/2015 07:00   I have personally reviewed and evaluated these images and lab results as part of my medical decision-making.    MDM   Final diagnoses:  Acute URI   Plan discharge  Devoria Albe, MD, Concha Pyo, MD 09/11/15 [phone]

## 2015-09-14 LAB — CULTURE, GROUP A STREP (THRC)

## 2015-10-06 ENCOUNTER — Encounter: Payer: Self-pay | Admitting: Obstetrics and Gynecology

## 2015-10-06 ENCOUNTER — Ambulatory Visit (INDEPENDENT_AMBULATORY_CARE_PROVIDER_SITE_OTHER): Payer: BLUE CROSS/BLUE SHIELD | Admitting: Obstetrics and Gynecology

## 2015-10-06 VITALS — BP 146/90 | Ht <= 58 in | Wt 229.0 lb

## 2015-10-06 DIAGNOSIS — N871 Moderate cervical dysplasia: Secondary | ICD-10-CM

## 2015-10-06 NOTE — Progress Notes (Signed)
Patient ID: Kerry Ryan, female   DOB: 03-26-1986, 30 y.o.   MRN: [id number] Pt here today for follow up from LEEP. Pt denies any problems or concerns at this time.

## 2015-10-06 NOTE — Progress Notes (Signed)
[facility] Visit  Patient name: IDELL HISSONG MRN [id number]  Date of birth: 1985-12-16  CC & HPI:  Lauralie Blacksher Nicanor Bake is a 30 y.o. female presenting today for follow-up of LEEP and discussion of results. Now 4 wk s/p leep. Path focal involvement of endocervical margin of posterior specimen. While this could represent cutting artifact due to circumferential lesion that was necessarily removed in 2 specimens, the need for confimring presence or absence of residual dysplasia is obvious. Discussed with pt and husband.  ROS:  A complete 10 system review of systems was obtained and all systems are negative except as noted in the HPI and PMH.    Pertinent History Reviewed:   Reviewed: Significant for none Medical         Past Medical History  Diagnosis Date  . Depression   . Anxiety   . GERD (gastroesophageal reflux disease)   . Acne   . Chlamydia infection                               Surgical Hx:    Past Surgical History  Procedure Laterality Date  . No past surgeries     Medications: Reviewed & Updated - see associated section                       Current outpatient prescriptions:  .  acetaminophen (TYLENOL) 500 MG tablet, Take 1,000 mg by mouth 2 (two) times daily as needed. Reported on 09/08/2015, Disp: , Rfl:  .  cetirizine (ZYRTEC) 10 MG tablet, Take 10 mg by mouth daily., Disp: , Rfl:  .  guaiFENesin (MUCINEX) 600 MG 12 hr tablet, Take by mouth 2 (two) times daily., Disp: , Rfl:  .  ibuprofen (ADVIL,MOTRIN) 200 MG tablet, Take 200 mg by mouth 2 (two) times daily as needed., Disp: , Rfl:  .  metroNIDAZOLE (METROGEL) 0.75 % vaginal gel, Place 1 Applicatorful vaginally at bedtime. Apply one applicatorful to vagina at bedtime 2-3 times weekly beginning 1 wk after procedure., Disp: 70 g, Rfl: 1 .  norethindrone (ORTHO MICRONOR) 0.35 MG tablet, Take 1 tablet by mouth daily., Disp: , Rfl:  .  omeprazole (PRILOSEC) 20 MG capsule, Take 1 capsule (20 mg total) by mouth  daily., Disp: 30 capsule, Rfl: 5   Social History: Reviewed -  reports that she has never smoked. She does not have any smokeless tobacco history on file.  Objective Findings:  Vitals: Blood pressure 146/90, height  (1.473 m), weight 229 lb (103.874 kg), last menstrual period 09/15/2015.  Physical Examination: General appearance - alert, well appearing, and in no distress, oriented to person, place, and time and overweight Mental status - alert, oriented to person, place, and time, normal mood, behavior, speech, dress, motor activity, and thought processes, affect appropriate to mood Pelvic -  VULVA: normal appearing vulva with no masses, tenderness or lesions,  VAGINA: normal appearing vagina with normal color and discharge, no lesions,  CERVIX: healing well S/P LEEP no d/c  Long speculum needed.  Assessment & Plan:   A:  1. Satisfactory healing S/P LEEP. 2. ?endocervical involvement of LEEP specimen, on bisected specimen.  P:  1. Follow up in 4 weeks for colposcopy.    By signing my name below, I, Ronney Lion, attest that this documentation has been prepared under the direction and in the presence of Tilda Burrow, MD. Electronically Signed:  Ronney LionSuzanne Le, ED Scribe. 10/06/2015. 10:41 AM.  I personally performed the services described in this documentation, which was SCRIBED in my presence. The recorded information has been reviewed and considered accurate. It has been edited as necessary during review. Tilda BurrowFERGUSON,Devaney Segers V, MD

## 2015-11-03 ENCOUNTER — Ambulatory Visit (INDEPENDENT_AMBULATORY_CARE_PROVIDER_SITE_OTHER): Payer: BLUE CROSS/BLUE SHIELD | Admitting: Obstetrics and Gynecology

## 2015-11-03 ENCOUNTER — Encounter: Payer: Self-pay | Admitting: Obstetrics and Gynecology

## 2015-11-03 ENCOUNTER — Other Ambulatory Visit: Payer: Self-pay | Admitting: Obstetrics and Gynecology

## 2015-11-03 VITALS — BP 130/90 | Ht <= 58 in | Wt 231.5 lb

## 2015-11-03 DIAGNOSIS — Z3202 Encounter for pregnancy test, result negative: Secondary | ICD-10-CM | POA: Diagnosis not present

## 2015-11-03 DIAGNOSIS — N72 Inflammatory disease of cervix uteri: Secondary | ICD-10-CM | POA: Diagnosis not present

## 2015-11-03 DIAGNOSIS — N871 Moderate cervical dysplasia: Secondary | ICD-10-CM

## 2015-11-03 DIAGNOSIS — Z3200 Encounter for pregnancy test, result unknown: Secondary | ICD-10-CM

## 2015-11-03 LAB — POCT URINE PREGNANCY: PREG TEST UR: NEGATIVE

## 2015-11-03 NOTE — Progress Notes (Signed)
Patient ID: Kerry Ryan, female   DOB: 1985-11-09, 30 y.o.   MRN: [id number]  Kerry Ryan 30 y.o. No obstetric history on file. here for colposcopy. No pap smear was found on file.  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 The margins were clear in the anterior cervical LEEP specimen, but the inner margin is "focally involved" in the posterior cervical LEEP specimen. It is the provider (J[facility]) interpretation is that this represents the intersection of the 2 samples, and so the margin may not have residual disease. Per my note from pathology results on 09/10/15, "It will be necessary to re-assess the posterior biopsy site postop in 6 wks, with colposocpy and give consideration to an excision of any areas of residual dysplasia that is identified."   Discussed role for HPV in cervical dysplasia, need for surveillance.   Patient given informed consent, signed copy in the chart, time out was performed.  Placed in lithotomy position. Cervix viewed with speculum and colposcope after application of acetic acid.   Cervical mucus on the anterior and posterior lip  Colposcopy adequate? Yes  no visible lesions; biopsies obtained at 6 o'clock.   ECC specimen not obtained. All specimens were labelled and sent to pathology.  Colposcopy IMPRESSION: No visible dysplasia, confirmatory biopsies taken at 6 o'clock  Patient was given post procedure instructions. Will follow up pathology and manage accordingly. Results by MyChart. Routine preventative health maintenance measures emphasized, with follow-up annual exam in 1 year. Patient advised to avoid sexual activity for 5 days-1 week.    By signing my name below, I, Ronney LionSuzanne Le, attest that this documentation has been prepared under the direction and in the presence of Tilda BurrowJohn Kadelyn Dimascio V, MD. Electronically Signed:  Ronney LionSuzanne Le, ED Scribe. 11/03/2015. 10:38 AM.  I personally performed the services described in this documentation, which was SCRIBED in my presence. The recorded information has been reviewed and considered accurate. It has been edited as necessary during review. Tilda BurrowFERGUSON,Emilynn Srinivasan V, MD

## 2016-03-22 ENCOUNTER — Ambulatory Visit (INDEPENDENT_AMBULATORY_CARE_PROVIDER_SITE_OTHER): Payer: BLUE CROSS/BLUE SHIELD | Admitting: Family Medicine

## 2016-03-22 ENCOUNTER — Encounter: Payer: Self-pay | Admitting: Family Medicine

## 2016-03-22 VITALS — BP 120/70 | Temp 98.2°F | Ht 59.5 in | Wt 236.2 lb

## 2016-03-22 DIAGNOSIS — M7582 Other shoulder lesions, left shoulder: Secondary | ICD-10-CM | POA: Diagnosis not present

## 2016-03-22 DIAGNOSIS — M778 Other enthesopathies, not elsewhere classified: Secondary | ICD-10-CM

## 2016-03-22 MED ORDER — NAPROXEN 500 MG PO TABS: 500.0000 mg | ORAL_TABLET | Freq: Two times a day (BID) | ORAL | 2 refills | Status: DC

## 2016-03-22 NOTE — Progress Notes (Signed)
   Subjective:    Patient ID: Kerry Ryan, female    DOB: 1986-01-28, 30 y.o.   MRN: [id number]  Shoulder Pain   The pain is present in the left shoulder and right shoulder. This is a new problem. The current episode started more than 1 month ago. The problem occurs intermittently. The problem has been unchanged. The pain is moderate. The symptoms are aggravated by activity. She has tried acetaminophen for the symptoms. The treatment provided no relief.     She works at [employer]. She does her best with lifting and pulling boxes. She has to lift heavy items sometimes above her shoulder level.  Review of Systems She denies any chest pressure tightness pain or shortness of breath    Objective:   Physical Exam Lungs clear heart regular bilateral shoulder discomfort noted more on the left than the right positive discomfort with certain movements consistent with tendinitis I do not find evidence of a rotator cuff tear       Assessment & Plan:  Bilateral shoulder tendinitis range of motion exercises shown exercise pamphlet given to the patient if not doing better over the next 3-4 weeks notify us we will help set up with orthopedics anti-inflammatory twice a day over the course of the next 7-14 days  Avoid lifting any objects 30 pounds or greater at chest level or higher

## 2016-06-15 ENCOUNTER — Encounter: Payer: Self-pay | Admitting: Obstetrics and Gynecology

## 2016-06-15 ENCOUNTER — Ambulatory Visit (INDEPENDENT_AMBULATORY_CARE_PROVIDER_SITE_OTHER): Payer: BLUE CROSS/BLUE SHIELD | Admitting: Obstetrics and Gynecology

## 2016-06-15 VITALS — BP 130/90 | HR 76 | Ht <= 58 in | Wt 239.0 lb

## 2016-06-15 DIAGNOSIS — Z309 Encounter for contraceptive management, unspecified: Secondary | ICD-10-CM | POA: Insufficient documentation

## 2016-06-15 DIAGNOSIS — N871 Moderate cervical dysplasia: Secondary | ICD-10-CM

## 2016-06-15 DIAGNOSIS — Z308 Encounter for other contraceptive management: Secondary | ICD-10-CM | POA: Diagnosis not present

## 2016-06-15 DIAGNOSIS — Z3041 Encounter for surveillance of contraceptive pills: Secondary | ICD-10-CM

## 2016-06-15 MED ORDER — NORETHINDRONE 0.35 MG PO TABS: 1.0000 | ORAL_TABLET | Freq: Every day | ORAL | 8 refills | Status: DC

## 2016-06-15 NOTE — Progress Notes (Signed)
   [facility]  @DATE @            Patient name: Kerry PlummerCarina A Ryan MRN [id number]  Date of birth: Feb 03, 1986  CC & HPI:  Concha NorwayCarina A Nicanor BakeSanabria is a 30 y.o. female presenting today for contraception management   ROS:  ROS Recent LEEP in March for CIN, will coordinate visits to handle ocp at same time No wt gain on current OC. Continued obesity without much progress In  Wt loss.  Pertinent History Reviewed:   Reviewed: Significant for  Medical         Past Medical History:  Diagnosis Date  . Acne   . Anxiety   . Chlamydia infection   . Depression   . GERD (gastroesophageal reflux disease)   . Vaginal Pap smear, abnormal                               Surgical Hx:    Past Surgical History:  Procedure Laterality Date  . NO PAST SURGERIES     Medications: Reviewed & Updated - see associated section                       Current Outpatient Prescriptions:  .  Omega-3 Fatty Acids (FISH OIL PO), Take by mouth., Disp: , Rfl:  .  omeprazole (PRILOSEC) 20 MG capsule, Take 1 capsule (20 mg total) by mouth daily., Disp: 30 capsule, Rfl: 5 .  acetaminophen (TYLENOL) 500 MG tablet, Take 1,000 mg by mouth 2 (two) times daily as needed. Reported on 09/08/2015, Disp: , Rfl:  .  cetirizine (ZYRTEC) 10 MG tablet, Take 10 mg by mouth daily., Disp: , Rfl:  .  guaiFENesin (MUCINEX) 600 MG 12 hr tablet, Take by mouth 2 (two) times daily., Disp: , Rfl:  .  ibuprofen (ADVIL,MOTRIN) 200 MG tablet, Take 200 mg by mouth 2 (two) times daily as needed., Disp: , Rfl:  .  naproxen (NAPROSYN) 500 MG tablet, Take 1 tablet (500 mg total) by mouth 2 (two) times daily with a meal. (Patient not taking: Reported on 06/15/2016), Disp: 40 tablet, Rfl: 2 .  norethindrone (ORTHO MICRONOR) 0.35 MG tablet, Take 1 tablet by mouth daily., Disp: , Rfl:    Social History: Reviewed -  reports that she has never smoked. She has never used smokeless tobacco.  Objective Findings:  Vitals: Blood pressure 130/90,  pulse 76, height 4\' 10"  (1.473 m), weight 239 lb (108.4 kg). Body mass index is 49.95 kg/m.  Physical Examination: General appearance - alert, well appearing, and in no distress, oriented to person, place, and time and overweight Mental status - alert, oriented to person, place, and time, affect appropriate to mood Eyes - pupils equal and reactive, extraocular eye movements intact Abdomen - soft, nontender, nondistended, no masses or organomegaly   Assessment & Plan:   A:  1. contr management.  P:  1. Renew Micronor x 8 months. Renew at time of PAP's

## 2016-07-10 ENCOUNTER — Encounter: Payer: Self-pay | Admitting: Family Medicine

## 2016-07-10 ENCOUNTER — Ambulatory Visit (INDEPENDENT_AMBULATORY_CARE_PROVIDER_SITE_OTHER): Payer: BLUE CROSS/BLUE SHIELD | Admitting: Family Medicine

## 2016-07-10 VITALS — BP 130/90 | Temp 98.4°F | Ht 59.5 in | Wt 238.2 lb

## 2016-07-10 DIAGNOSIS — B9689 Other specified bacterial agents as the cause of diseases classified elsewhere: Secondary | ICD-10-CM | POA: Diagnosis not present

## 2016-07-10 DIAGNOSIS — J019 Acute sinusitis, unspecified: Secondary | ICD-10-CM | POA: Diagnosis not present

## 2016-07-10 MED ORDER — AMOXICILLIN-POT CLAVULANATE 875-125 MG PO TABS: 1.0000 | ORAL_TABLET | Freq: Two times a day (BID) | ORAL | 0 refills | Status: DC

## 2016-07-10 NOTE — Progress Notes (Signed)
   Subjective:    Patient ID: Kerry Ryan, female    DOB: 16-Apr-1986, 30 y.o.   MRN: [id number]  Sinusitis  This is a new problem. The current episode started in the past 7 days. The problem is unchanged. There has been no fever. The pain is moderate. Associated symptoms include congestion, coughing, ear pain, a hoarse voice and a sore throat. Pertinent negatives include no shortness of breath. Treatments tried: cough drops, dayquil, nyquil, alka seltzer. The treatment provided no relief.   Patient has no other concerns at this time.    Review of Systems  Constitutional: Negative for activity change and fever.  HENT: Positive for congestion, ear pain, hoarse voice, rhinorrhea and sore throat.   Eyes: Negative for discharge.  Respiratory: Positive for cough. Negative for shortness of breath and wheezing.   Cardiovascular: Negative for chest pain.       Objective:   Physical Exam  Constitutional: She appears well-developed.  HENT:  Head: Normocephalic.  Nose: Nose normal.  Mouth/Throat: Oropharynx is clear and moist. No oropharyngeal exudate.  Neck: Neck supple.  Cardiovascular: Normal rate and normal heart sounds.   No murmur heard. Pulmonary/Chest: Effort normal and breath sounds normal. She has no wheezes.  Lymphadenopathy:    She has no cervical adenopathy.  Skin: Skin is warm and dry.  Nursing note and vitals reviewed.         Assessment & Plan:  Viral like illness Secondary rhinosinusitis Antibiotic prescribed warning signs discussed follow-up if progressive troubles

## 2016-07-10 NOTE — Patient Instructions (Signed)

## 2016-11-16 ENCOUNTER — Encounter: Payer: Self-pay | Admitting: Family

## 2016-11-16 ENCOUNTER — Ambulatory Visit (INDEPENDENT_AMBULATORY_CARE_PROVIDER_SITE_OTHER): Payer: BLUE CROSS/BLUE SHIELD | Admitting: Family

## 2016-11-16 VITALS — BP 144/103 | HR 91 | Temp 97.3°F | Ht 59.5 in | Wt 233.2 lb

## 2016-11-16 DIAGNOSIS — J029 Acute pharyngitis, unspecified: Secondary | ICD-10-CM | POA: Diagnosis not present

## 2016-11-16 DIAGNOSIS — R030 Elevated blood-pressure reading, without diagnosis of hypertension: Secondary | ICD-10-CM | POA: Diagnosis not present

## 2016-11-16 MED ORDER — AZITHROMYCIN 250 MG PO TABS: ORAL_TABLET | ORAL | 0 refills | Status: DC

## 2016-11-16 NOTE — Patient Instructions (Signed)

## 2016-11-16 NOTE — Progress Notes (Signed)
Subjective:    Patient ID: Kerry Ryan, female    DOB: [birth year], 31 y.o.   MRN: [id number]  PT presents to the office today to establish care and complaints of URI. PT went to the Urgent Care on 11/13/16 and was diagnosed acute URI and was given allergra, lidocaine viscous, and afrin. Pt states she is now worse.  Cough  This is a new problem. The current episode started in the past 7 days. The problem has been waxing and waning. The problem occurs every few minutes. The cough is non-productive. Associated symptoms include ear congestion, ear pain, a fever, headaches, nasal congestion, postnasal drip and a sore throat. Pertinent negatives include no chills, myalgias, rhinorrhea, shortness of breath or wheezing. The symptoms are aggravated by lying down and pollens. 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 The treatment provided mild relief. There is no history of asthma or COPD.      Review of Systems  Constitutional: Positive for fever. Negative for chills.  HENT: Positive for ear pain, postnasal drip and sore throat. Negative for rhinorrhea.   Respiratory: Positive for cough. Negative for shortness of breath and wheezing.   Musculoskeletal: Negative for myalgias.  Neurological: Positive for headaches.  All other systems reviewed and are negative.  Social History   Social History  . Marital status: Single    Spouse name: N/A  . Number of children: N/A  . Years of education: N/A   Social History Main Topics  . Smoking status: Never Smoker  . Smokeless tobacco: Never Used  . Alcohol use No  . Drug use: No  . Sexual activity: Not Currently    Birth control/ protection: Pill   Other Topics Concern  . None   Social History Narrative  . None   Family History  Problem Relation Age of Onset  . Hypertension Mother   . Sarcoidosis Mother   . Cancer Paternal Grandmother     breast  . Other Paternal Grandmother     gangrene       Objective:   Physical Exam    Constitutional: She is oriented to person, place, and time. She appears well-developed and well-nourished. No distress.  Morbid obese   HENT:  Head: Normocephalic and atraumatic.  Right Ear: External ear normal.  Left Ear: External ear normal.  Nose: Mucosal edema and rhinorrhea present.  Mouth/Throat: Oropharyngeal exudate, posterior oropharyngeal edema and posterior oropharyngeal erythema present.  Eyes: Pupils are equal, round, and reactive to light.  Neck: Normal range of motion. Neck supple. No thyromegaly present.  Cardiovascular: Normal rate, regular rhythm, normal heart sounds and intact distal pulses.   No murmur heard. Pulmonary/Chest: Effort normal and breath sounds normal. No respiratory distress. She has no wheezes.  Abdominal: Soft. Bowel sounds are normal. She exhibits no distension. There is no tenderness.  Musculoskeletal: Normal range of motion. She exhibits no edema or tenderness.  Neurological: She is alert and oriented to person, place, and time. She has normal reflexes. No cranial nerve deficit.  Skin: Skin is warm and dry.  Psychiatric: She has a normal mood and affect. Her behavior is normal. Judgment and thought content normal.  Vitals reviewed.     BP (!) 144/103   Pulse 91   Temp 97.3 F (36.3 C) (Oral)   Ht 4' 11.5" (1.511 m)   Wt 233 lb 3.2 oz (105.8 kg)   LMP 11/16/2016   BMI 46.31 kg/m      Assessment & Plan:  1.  Acute pharyngitis, unspecified etiology - Take meds as prescribed - Use a cool mist humidifier  -Use saline nose sprays frequently -Saline irrigations of the nose can be very helpful if done frequently.  * 4X daily for 1 week*  * Use of a nettie pot can be helpful with this. Follow directions with this* -Force fluids -For any cough or congestion  Use plain Mucinex- regular strength or max strength is fine   * Children- consult with Pharmacist for dosing -For fever or aces or pains- take tylenol or ibuprofen appropriate for age  and weight.  * for fevers greater than 101 orally you may alternate ibuprofen and tylenol every  3 hours. -Throat lozenges if help -New toothbrush in 3 days - azithromycin (ZITHROMAX Z-PAK) 250 MG tablet; As directed  Dispense: 1 each; Refill: 0  2. Morbid obesity (HCC)  3. Elevated blood pressure reading -Pt told to make CPE appt. Will recheck BP at that time.  Jannifer Rodney, FNP

## 2016-12-13 ENCOUNTER — Ambulatory Visit: Payer: BLUE CROSS/BLUE SHIELD | Admitting: Obstetrics and Gynecology

## 2017-01-01 ENCOUNTER — Encounter: Payer: Self-pay | Admitting: Obstetrics and Gynecology

## 2017-01-01 ENCOUNTER — Other Ambulatory Visit (HOSPITAL_COMMUNITY)
Admission: RE | Admit: 2017-01-01 | Discharge: 2017-01-01 | Disposition: A | Discharge status: Home / Self Care | Payer: BLUE CROSS/BLUE SHIELD | Source: Ambulatory Visit | Attending: Obstetrics and Gynecology | Admitting: Obstetrics and Gynecology

## 2017-01-01 ENCOUNTER — Ambulatory Visit (INDEPENDENT_AMBULATORY_CARE_PROVIDER_SITE_OTHER): Payer: BLUE CROSS/BLUE SHIELD | Admitting: Obstetrics and Gynecology

## 2017-01-01 VITALS — BP 130/80 | HR 68 | Ht 59.0 in | Wt 236.0 lb

## 2017-01-01 DIAGNOSIS — Z01419 Encounter for gynecological examination (general) (routine) without abnormal findings: Secondary | ICD-10-CM | POA: Diagnosis present

## 2017-01-01 DIAGNOSIS — Z3009 Encounter for other general counseling and advice on contraception: Secondary | ICD-10-CM | POA: Diagnosis not present

## 2017-01-01 NOTE — Progress Notes (Signed)
  Assessment:  Annual Gyn Exam History of LEEP with questionable focal residual posterior margin involvement, with negative biopsy 4 2017   Obesity History of breast lump currently none Plan:  1. pap smear done, next pap due in 1 year 2. return annually or prn 3    Annual mammogram advised Subjective:  Kerry Ryan is a 31 y.o. female G0P0000 who presents for annual exam and pap smear. Patient's last menstrual period was 12/10/2016 (exact date). Pt has a h/o LEEP in 2017 that showed questioinable  focal residual margin posteriorly. Follow up biopsies were negative in 2017; today will be first pap since then. Pt also notes h/o breast lump found several months ago. No family h/o early breast CA. The patient has no acute complaints or symptoms at this time.   The following portions of the patient's history were reviewed and updated as appropriate: allergies, current medications, past family history, past medical history, past social history, past surgical history and problem list.  Past Medical History:  Diagnosis Date  . Acne   . Anxiety   . Chlamydia infection   . Depression   . GERD (gastroesophageal reflux disease)   . Vaginal Pap smear, abnormal     Past Surgical History:  Procedure Laterality Date  . COLPOSCOPY    . LEEP    . NO PAST SURGERIES       Current Outpatient Prescriptions:  .  fexofenadine (ALLEGRA) 180 MG tablet, Take 180 mg by mouth daily., Disp: , Rfl:  .  Lactobacillus (REPHRESH PRO-B) CAPS, Take by mouth daily., Disp: , Rfl:  .  norethindrone (ORTHO MICRONOR) 0.35 MG tablet, Take 1 tablet (0.35 mg total) by mouth daily., Disp: 1 Package, Rfl: 8 .  omeprazole (PRILOSEC) 20 MG capsule, Take 1 capsule (20 mg total) by mouth daily., Disp: 30 capsule, Rfl: 5  Review of Systems Otherwise negative for acute change except as noted in the HPI.  Objective:  BP 130/80 (BP Location: Left Arm, Patient Position: Sitting, Cuff Size: Large)   Pulse 68   Ht 4\' 11"   (1.499 m)   Wt 236 lb (107 kg)   LMP 12/10/2016 (Exact Date)   BMI 47.67 kg/m    BMI: Body mass index is 47.67 kg/m.  General Appearance: Alert, appropriate appearance for age. No acute distress HEENT: Grossly normal Neck / Thyroid:  Cardiovascular: RRR; normal S1, S2, no murmur Lungs: CTA bilaterally Back: No CVAT Breast Exam: No dimpling, nipple retraction or discharge. No abnormal masses or nodes. Gastrointestinal: Soft, non-tender, no masses or organomegaly Pelvic Exam: External genitalia: normal general appearance Vaginal: normal mucosa without prolapse or lesions and normal without tenderness, induration or masses Cervix: normal appearance Adnexa: normal bimanual exam Uterus: normal single, nontender  PAP: Pap smear done today. Rectovaginal: not indicated Lymphatic Exam: Non-palpable nodes in neck, clavicular, axillary, or inguinal regions  Skin: no rash or abnormalities Neurologic: Normal gait and speech, no tremor  Psychiatric: Alert and oriented, appropriate affect.  Urinalysis:Not done   By signing my name below, I, Freida Busmaniana Omoyeni, attest that this documentation has been prepared under the direction and in the presence of Tilda BurrowJohn V Latrish Mogel, MD . Electronically Signed: Freida Busmaniana Omoyeni, Scribe. 01/01/2017. 11:26 AM. I personally performed the services described in this documentation, which was SCRIBED in my presence. The recorded information has been reviewed and considered accurate. It has been edited as necessary during review. Tilda BurrowFERGUSON,Kyndell Zeiser V, MD

## 2017-01-04 LAB — CYTOLOGY - PAP
CHLAMYDIA, DNA PROBE: NEGATIVE
DIAGNOSIS: NEGATIVE
HPV (WINDOPATH): NOT DETECTED
NEISSERIA GONORRHEA: NEGATIVE

## 2017-02-19 ENCOUNTER — Other Ambulatory Visit: Payer: Self-pay | Admitting: Obstetrics and Gynecology

## 2017-02-19 NOTE — Telephone Encounter (Signed)
refil norethindrone x 84 day x 2 ordered

## 2017-07-19 ENCOUNTER — Encounter: Payer: Self-pay | Admitting: Family

## 2017-07-19 ENCOUNTER — Ambulatory Visit (INDEPENDENT_AMBULATORY_CARE_PROVIDER_SITE_OTHER): Payer: BLUE CROSS/BLUE SHIELD | Admitting: Family

## 2017-07-19 VITALS — BP 142/100 | HR 79 | Temp 97.3°F | Ht 59.0 in | Wt 232.8 lb

## 2017-07-19 DIAGNOSIS — R110 Nausea: Secondary | ICD-10-CM

## 2017-07-19 DIAGNOSIS — L659 Nonscarring hair loss, unspecified: Secondary | ICD-10-CM

## 2017-07-19 DIAGNOSIS — R5383 Other fatigue: Secondary | ICD-10-CM | POA: Diagnosis not present

## 2017-07-19 LAB — PREGNANCY, URINE: PREG TEST UR: NEGATIVE

## 2017-07-19 NOTE — Progress Notes (Signed)
   Subjective:    Patient ID: Kerry Ryan, female    DOB: 09-04-1985, 31 y.o.   MRN: [id number]  HPI Pt presents to the office today with complaints of nausea, hair loss,and  fatigue that started several months ago, but has become worse over the last month.  Pt states she has taken a pregnancy test at home that was negative and started her period two weeks ago.  Pt states she stopped her OC two months ago because she thought some of her symptoms were related to taking the OC. Her symptoms have not improved since stopping, but states she does not wish to restart at this time.    Review of Systems  Constitutional: Positive for fatigue.  Gastrointestinal: Positive for nausea.  All other systems reviewed and are negative.      Objective:   Physical Exam  Constitutional: She is oriented to person, place, and time. She appears well-developed and well-nourished. No distress.  Morbid obese  HENT:  Head: Normocephalic and atraumatic.  Right Ear: External ear normal.  Left Ear: External ear normal.  Nose: Nose normal.  Mouth/Throat: Oropharynx is clear and moist.  Eyes: Pupils are equal, round, and reactive to light.  Neck: Normal range of motion. Neck supple. No thyromegaly present.  Cardiovascular: Normal rate, regular rhythm, normal heart sounds and intact distal pulses.  No murmur heard. Pulmonary/Chest: Effort normal and breath sounds normal. No respiratory distress. She has no wheezes.  Abdominal: Soft. Bowel sounds are normal. She exhibits no distension. There is no tenderness.  Musculoskeletal: Normal range of motion. She exhibits no edema or tenderness.  Neurological: She is alert and oriented to person, place, and time.  Skin: Skin is warm and dry.  Psychiatric: She has a normal mood and affect. Her behavior is normal. Judgment and thought content normal.  Vitals reviewed.     BP (!) 142/100   Pulse 79   Temp (!) 97.3 F (36.3 C) (Oral)   Ht '4\' 11"'$  (1.499 m)   Wt  232 lb 12.8 oz (105.6 kg)   LMP 06/04/2017   BMI 47.02 kg/m  '    Assessment & Plan:  1. Fatigue, unspecified type - CMP14+EGFR - Anemia Profile B - VITAMIN D 25 Hydroxy (Vit-D Deficiency, Fractures) - TSH - Pregnancy, urine  2. Nausea - CMP14+EGFR - Pregnancy, urine  3. Hair loss - CMP14+EGFR - Anemia Profile B - TSH  Labs pending Take vitamin with food since having nausea  Encourage rest Force fluids RTO prn   Evelina Dun, FNP

## 2017-07-19 NOTE — Patient Instructions (Signed)

## 2017-07-20 LAB — CMP14+EGFR
A/G RATIO: 1.7 (ref 1.2–2.2)
ALBUMIN: 4.3 g/dL (ref 3.5–5.5)
ALT: 20 IU/L (ref 0–32)
AST: 15 IU/L (ref 0–40)
Alkaline Phosphatase: 91 IU/L (ref 39–117)
BUN / CREAT RATIO: 12 (ref 9–23)
BUN: 9 mg/dL (ref 6–20)
Bilirubin Total: 0.3 mg/dL (ref 0.0–1.2)
CALCIUM: 9.4 mg/dL (ref 8.7–10.2)
CO2: 22 mmol/L (ref 20–29)
Chloride: 102 mmol/L (ref 96–106)
Creatinine, Ser: 0.78 mg/dL (ref 0.57–1.00)
GFR, EST AFRICAN AMERICAN: 117 mL/min/{1.73_m2} (ref >59)
GFR, EST NON AFRICAN AMERICAN: 102 mL/min/{1.73_m2} (ref >59)
GLOBULIN, TOTAL: 2.5 g/dL (ref 1.5–4.5)
Glucose: 107 mg/dL — ABNORMAL HIGH (ref 65–99)
POTASSIUM: 5.2 mmol/L (ref 3.5–5.2)
SODIUM: 138 mmol/L (ref 134–144)
TOTAL PROTEIN: 6.8 g/dL (ref 6.0–8.5)

## 2017-07-20 LAB — ANEMIA PROFILE B
Basophils Absolute: 0 10*3/uL (ref 0.0–0.2)
Basos: 0 %
EOS (ABSOLUTE): 0.1 10*3/uL (ref 0.0–0.4)
Eos: 1 %
FERRITIN: 49 ng/mL (ref 15–150)
Hematocrit: 35.9 % (ref 34.0–46.6)
Hemoglobin: 12.3 g/dL (ref 11.1–15.9)
IMMATURE GRANS (ABS): 0 10*3/uL (ref 0.0–0.1)
IMMATURE GRANULOCYTES: 0 %
Iron Saturation: 14 % — ABNORMAL LOW (ref 15–55)
Iron: 52 ug/dL (ref 27–159)
LYMPHS ABS: 2.3 10*3/uL (ref 0.7–3.1)
LYMPHS: 30 %
MCH: 28 pg (ref 26.6–33.0)
MCHC: 34.3 g/dL (ref 31.5–35.7)
MCV: 82 fL (ref 79–97)
MONOS ABS: 0.5 10*3/uL (ref 0.1–0.9)
Monocytes: 7 %
NEUTROS PCT: 62 %
Neutrophils Absolute: 4.8 10*3/uL (ref 1.4–7.0)
Platelets: 368 10*3/uL (ref 150–379)
RBC: 4.4 x10E6/uL (ref 3.77–5.28)
RDW: 14.8 % (ref 12.3–15.4)
Retic Ct Pct: 1.5 % (ref 0.6–2.6)
Total Iron Binding Capacity: 383 ug/dL (ref 250–450)
UIBC: 331 ug/dL (ref 131–425)
Vitamin B-12: 338 pg/mL (ref 232–1245)
WBC: 7.7 10*3/uL (ref 3.4–10.8)

## 2017-07-20 LAB — TSH: TSH: 2.29 u[IU]/mL (ref 0.450–4.500)

## 2017-07-20 LAB — VITAMIN D 25 HYDROXY (VIT D DEFICIENCY, FRACTURES): Vit D, 25-Hydroxy: 29.9 ng/mL — ABNORMAL LOW (ref 30.0–100.0)

## 2017-09-18 ENCOUNTER — Ambulatory Visit (INDEPENDENT_AMBULATORY_CARE_PROVIDER_SITE_OTHER): Payer: BLUE CROSS/BLUE SHIELD | Admitting: Adult Health

## 2017-09-18 ENCOUNTER — Encounter: Payer: Self-pay | Admitting: Adult Health

## 2017-09-18 VITALS — BP 132/80 | HR 82 | Ht <= 58 in | Wt 231.8 lb

## 2017-09-18 DIAGNOSIS — N926 Irregular menstruation, unspecified: Secondary | ICD-10-CM

## 2017-09-18 DIAGNOSIS — O3440 Maternal care for other abnormalities of cervix, unspecified trimester: Secondary | ICD-10-CM

## 2017-09-18 DIAGNOSIS — R110 Nausea: Secondary | ICD-10-CM

## 2017-09-18 DIAGNOSIS — Z3201 Encounter for pregnancy test, result positive: Secondary | ICD-10-CM

## 2017-09-18 DIAGNOSIS — O3680X0 Pregnancy with inconclusive fetal viability, not applicable or unspecified: Secondary | ICD-10-CM | POA: Insufficient documentation

## 2017-09-18 DIAGNOSIS — Z3A01 Less than 8 weeks gestation of pregnancy: Secondary | ICD-10-CM | POA: Insufficient documentation

## 2017-09-18 DIAGNOSIS — R109 Unspecified abdominal pain: Secondary | ICD-10-CM

## 2017-09-18 DIAGNOSIS — Z9889 Other specified postprocedural states: Secondary | ICD-10-CM | POA: Insufficient documentation

## 2017-09-18 LAB — POCT URINE PREGNANCY: Preg Test, Ur: POSITIVE — AB

## 2017-09-18 NOTE — Progress Notes (Signed)
Subjective:     Patient ID: Kerry Ryan, female   DOB: 01/07/1986, [age] y.o.   MRN: [id number]  HPI Leanor KailCarina is a 32 year old white female in for UPT, has missed a period and had 4+HPTs.  [facility].   Review of Systems +missed period with 4+HPTs +nausea +cramping, no bleeding  Reviewed past medical,surgical, social and family history. Reviewed medications and allergies.     Objective:   Physical Exam BP 132/80 (BP Location: Right Arm, Patient Position: Sitting, Cuff Size: Normal)   Pulse 82   Ht 4\' 10"  (1.473 m)   Wt 231 lb 12.8 oz (105.1 kg)   LMP 08/01/2017   BMI 48.45 kg/m UPT +, about 6+6 weeks by LMP with EDD 05/08/18.Skin warm and dry. Neck: mid line trachea, normal thyroid, good ROM, no lymphadenopathy noted. Lungs: clear to ausculation bilaterally. Cardiovascular: regular rate and rhythm. Abdomen is soft and non tender. 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  She has cat at home, wear gloves and mask if has to empty litter, but get boyfriend to do.     Assessment:     1. Pregnancy test positive   2. Less than [redacted] weeks gestation of pregnancy   3. Encounter to determine fetal viability of pregnancy, single or unspecified fetus   4. History of loop electrosurgical excision procedure (LEEP) of cervix affecting pregnancy, antepartum       Plan:     Continue PNV Return in 10 days for dating US Eat often Review handouts on First trimester and by [facility]

## 2017-09-18 NOTE — Patient Instructions (Signed)
First Trimester of Pregnancy The first trimester of pregnancy is from week 1 until the end of week 13 (months 1 through 3). A week after a sperm fertilizes an egg, the egg will implant on the wall of the uterus. This embryo will begin to develop into a baby. Genes from you and your partner will form the baby. The female genes will determine whether the baby will be a boy or a girl. At 6-8 weeks, the eyes and face will be formed, and the heartbeat can be seen on ultrasound. At the end of 12 weeks, all the baby's organs will be formed. Now that you are pregnant, you will want to do everything you can to have a healthy baby. Two of the most important things are to get good prenatal care and to follow your health care provider's instructions. Prenatal care is all the medical care you receive before the baby's birth. This care will help prevent, find, and treat any problems during the pregnancy and childbirth. Body changes during your first trimester Your body goes through many changes during pregnancy. The changes vary from woman to woman.  You may gain or lose a couple of pounds at first.  You may feel sick to your stomach (nauseous) and you may throw up (vomit). If the vomiting is uncontrollable, call your health care provider.  You may tire easily.  You may develop headaches that can be relieved by medicines. All medicines should be approved by your health care provider.  You may urinate more often. Painful urination may mean you have a bladder infection.  You may develop heartburn as a result of your pregnancy.  You may develop constipation because certain hormones are causing the muscles that push stool through your intestines to slow down.  You may develop hemorrhoids or swollen veins (varicose veins).  Your breasts may begin to grow larger and become tender. Your nipples may stick out more, and the tissue that surrounds them (areola) may become darker.  Your gums may bleed and may be  sensitive to brushing and flossing.  Dark spots or blotches (chloasma, mask of pregnancy) may develop on your face. This will likely fade after the baby is born.  Your menstrual periods will stop.  You may have a loss of appetite.  You may develop cravings for certain kinds of food.  You may have changes in your emotions from day to day, such as being excited to be pregnant or being concerned that something may go wrong with the pregnancy and baby.  You may have more vivid and strange dreams.  You may have changes in your hair. These can include thickening of your hair, rapid growth, and changes in texture. Some women also have hair loss during or after pregnancy, or hair that feels dry or thin. Your hair will most likely return to normal after your baby is born.  What to expect at prenatal visits During a routine prenatal visit:  You will be weighed to make sure you and the baby are growing normally.  Your blood pressure will be taken.  Your abdomen will be measured to track your baby's growth.  The fetal heartbeat will be listened to between weeks 10 and 14 of your pregnancy.  Test results from any previous visits will be discussed.  Your health care provider may ask you:  How you are feeling.  If you are feeling the baby move.  If you have had any abnormal symptoms, such as leaking fluid, bleeding, severe headaches,   or abdominal cramping.  If you are using any tobacco products, including cigarettes, chewing tobacco, and electronic cigarettes.  If you have any questions.  Other tests that may be performed during your first trimester include:  Blood tests to find your blood type and to check for the presence of any previous infections. The tests will also be used to check for low iron levels (anemia) and protein on red blood cells (Rh antibodies). Depending on your risk factors, or if you previously had diabetes during pregnancy, you may have tests to check for high blood  sugar that affects pregnant women (gestational diabetes).  Urine tests to check for infections, diabetes, or protein in the urine.  An ultrasound to confirm the proper growth and development of the baby.  Fetal screens for spinal cord problems (spina bifida) and Down syndrome.  HIV (human immunodeficiency virus) testing. Routine prenatal testing includes screening for HIV, unless you choose not to have this test.  You may need other tests to make sure you and the baby are doing well.  Follow these instructions at home: Medicines  Follow your health care provider's instructions regarding medicine use. Specific medicines may be either safe or unsafe to take during pregnancy.  Take a prenatal vitamin that contains at least 600 micrograms (mcg) of folic acid.  If you develop constipation, try taking a stool softener if your health care provider approves. Eating and drinking  Eat a balanced diet that includes fresh fruits and vegetables, whole grains, good sources of protein such as meat, eggs, or tofu, and low-fat dairy. Your health care provider will help you determine the amount of weight gain that is right for you.  Avoid raw meat and uncooked cheese. These carry germs that can cause birth defects in the baby.  Eating four or five small meals rather than three large meals a day may help relieve nausea and vomiting. If you start to feel nauseous, eating a few soda crackers can be helpful. Drinking liquids between meals, instead of during meals, also seems to help ease nausea and vomiting.  Limit foods that are high in fat and processed sugars, such as fried and sweet foods.  To prevent constipation: ? Eat foods that are high in fiber, such as fresh fruits and vegetables, whole grains, and beans. ? Drink enough fluid to keep your urine clear or pale yellow. Activity  Exercise only as directed by your health care provider. Most women can continue their usual exercise routine during  pregnancy. Try to exercise for 30 minutes at least 5 days a week. Exercising will help you: ? Control your weight. ? Stay in shape. ? Be prepared for labor and delivery.  Experiencing pain or cramping in the lower abdomen or lower back is a good sign that you should stop exercising. Check with your health care provider before continuing with normal exercises.  Try to avoid standing for long periods of time. Move your legs often if you must stand in one place for a long time.  Avoid heavy lifting.  Wear low-heeled shoes and practice good posture.  You may continue to have sex unless your health care provider tells you not to. Relieving pain and discomfort  Wear a good support bra to relieve breast tenderness.  Take warm sitz baths to soothe any pain or discomfort caused by hemorrhoids. Use hemorrhoid cream if your health care provider approves.  Rest with your legs elevated if you have leg cramps or low back pain.  If you develop   varicose veins in your legs, wear support hose. Elevate your feet for 15 minutes, 3-4 times a day. Limit salt in your diet. Prenatal care  Schedule your prenatal visits by the twelfth week of pregnancy. They are usually scheduled monthly at first, then more often in the last 2 months before delivery.  Write down your questions. Take them to your prenatal visits.  Keep all your prenatal visits as told by your health care provider. This is important. Safety  Wear your seat belt at all times when driving.  Make a list of emergency phone numbers, including numbers for family, friends, the hospital, and police and fire departments. General instructions  Ask your health care provider for a referral to a local prenatal education class. Begin classes no later than the beginning of month 6 of your pregnancy.  Ask for help if you have counseling or nutritional needs during pregnancy. Your health care provider can offer advice or refer you to specialists for help  with various needs.  Do not use hot tubs, steam rooms, or saunas.  Do not douche or use tampons or scented sanitary pads.  Do not cross your legs for long periods of time.  Avoid cat litter boxes and soil used by cats. These carry germs that can cause birth defects in the baby and possibly loss of the fetus by miscarriage or stillbirth.  Avoid all smoking, herbs, alcohol, and medicines not prescribed by your health care provider. Chemicals in these products affect the formation and growth of the baby.  Do not use any products that contain nicotine or tobacco, such as cigarettes and e-cigarettes. If you need help quitting, ask your health care provider. You may receive counseling support and other resources to help you quit.  Schedule a dentist appointment. At home, brush your teeth with a soft toothbrush and be gentle when you floss. Contact a health care provider if:  You have dizziness.  You have mild pelvic cramps, pelvic pressure, or nagging pain in the abdominal area.  You have persistent nausea, vomiting, or diarrhea.  You have a bad smelling vaginal discharge.  You have pain when you urinate.  You notice increased swelling in your face, hands, legs, or ankles.  You are exposed to fifth disease or chickenpox.  You are exposed to German measles (rubella) and have never had it. Get help right away if:  You have a fever.  You are leaking fluid from your vagina.  You have spotting or bleeding from your vagina.  You have severe abdominal cramping or pain.  You have rapid weight gain or loss.  You vomit blood or material that looks like coffee grounds.  You develop a severe headache.  You have shortness of breath.  You have any kind of trauma, such as from a fall or a car accident. Summary  The first trimester of pregnancy is from week 1 until the end of week 13 (months 1 through 3).  Your body goes through many changes during pregnancy. The changes vary from  woman to woman.  You will have routine prenatal visits. During those visits, your health care provider will examine you, discuss any test results you may have, and talk with you about how you are feeling. This information is not intended to replace advice given to you by your health care provider. Make sure you discuss any questions you have with your health care provider. Document Released: 07/11/2001 Document Revised: 06/28/2016 Document Reviewed: 06/28/2016 Elsevier Interactive Patient Education  2018 [employer]  

## 2017-09-27 ENCOUNTER — Ambulatory Visit (INDEPENDENT_AMBULATORY_CARE_PROVIDER_SITE_OTHER): Payer: BLUE CROSS/BLUE SHIELD

## 2017-09-27 DIAGNOSIS — O3680X0 Pregnancy with inconclusive fetal viability, not applicable or unspecified: Secondary | ICD-10-CM

## 2017-09-27 DIAGNOSIS — O3440 Maternal care for other abnormalities of cervix, unspecified trimester: Secondary | ICD-10-CM

## 2017-09-27 DIAGNOSIS — Z3A08 8 weeks gestation of pregnancy: Secondary | ICD-10-CM

## 2017-09-27 DIAGNOSIS — Z9889 Other specified postprocedural states: Secondary | ICD-10-CM

## 2017-09-27 NOTE — Progress Notes (Signed)
US 8+1 wks IUP w/ys,positive fht 169 bpm,normal ovaries bilat,crl 18.79 mm, EDD 05/08/2018 by LMP

## 2017-10-11 ENCOUNTER — Ambulatory Visit (INDEPENDENT_AMBULATORY_CARE_PROVIDER_SITE_OTHER): Payer: BLUE CROSS/BLUE SHIELD | Admitting: Advanced Practice Midwife

## 2017-10-11 ENCOUNTER — Encounter: Payer: Self-pay | Admitting: Advanced Practice Midwife

## 2017-10-11 ENCOUNTER — Ambulatory Visit: Payer: BLUE CROSS/BLUE SHIELD | Admitting: *Deleted

## 2017-10-11 VITALS — BP 154/118 | HR 100 | Wt 230.0 lb

## 2017-10-11 DIAGNOSIS — R110 Nausea: Secondary | ICD-10-CM

## 2017-10-11 DIAGNOSIS — Z3401 Encounter for supervision of normal first pregnancy, first trimester: Secondary | ICD-10-CM

## 2017-10-11 DIAGNOSIS — O10911 Unspecified pre-existing hypertension complicating pregnancy, first trimester: Secondary | ICD-10-CM

## 2017-10-11 DIAGNOSIS — O0990 Supervision of high risk pregnancy, unspecified, unspecified trimester: Secondary | ICD-10-CM | POA: Insufficient documentation

## 2017-10-11 DIAGNOSIS — Z331 Pregnant state, incidental: Secondary | ICD-10-CM

## 2017-10-11 DIAGNOSIS — O0991 Supervision of high risk pregnancy, unspecified, first trimester: Secondary | ICD-10-CM

## 2017-10-11 DIAGNOSIS — I10 Essential (primary) hypertension: Secondary | ICD-10-CM

## 2017-10-11 DIAGNOSIS — O26891 Other specified pregnancy related conditions, first trimester: Secondary | ICD-10-CM

## 2017-10-11 DIAGNOSIS — Z1389 Encounter for screening for other disorder: Secondary | ICD-10-CM

## 2017-10-11 DIAGNOSIS — Z3682 Encounter for antenatal screening for nuchal translucency: Secondary | ICD-10-CM

## 2017-10-11 DIAGNOSIS — Z3A10 10 weeks gestation of pregnancy: Secondary | ICD-10-CM

## 2017-10-11 LAB — POCT URINALYSIS DIPSTICK
GLUCOSE UA: NEGATIVE
Ketones, UA: NEGATIVE
LEUKOCYTES UA: NEGATIVE
Nitrite, UA: NEGATIVE
Protein, UA: NEGATIVE
RBC UA: NEGATIVE

## 2017-10-11 LAB — OB RESULTS CONSOLE GBS: GBS: POSITIVE

## 2017-10-11 MED ORDER — LABETALOL HCL 200 MG PO TABS: 200.0000 mg | ORAL_TABLET | Freq: Two times a day (BID) | ORAL | 3 refills | Status: DC

## 2017-10-11 MED ORDER — PROMETHAZINE HCL 25 MG PO TABS: 25.0000 mg | ORAL_TABLET | Freq: Four times a day (QID) | ORAL | 1 refills | Status: DC | PRN

## 2017-10-11 NOTE — Patient Instructions (Signed)
 First Trimester of Pregnancy The first trimester of pregnancy is from week 1 until the end of week 12 (months 1 through 3). A week after a sperm fertilizes an egg, the egg will implant on the wall of the uterus. This embryo will begin to develop into a baby. Genes from you and your partner are forming the baby. The female genes determine whether the baby is a boy or a girl. At 6-8 weeks, the eyes and face are formed, and the heartbeat can be seen on ultrasound. At the end of 12 weeks, all the baby's organs are formed.  Now that you are pregnant, you will want to do everything you can to have a healthy baby. Two of the most important things are to get good prenatal care and to follow your health care provider's instructions. Prenatal care is all the medical care you receive before the baby's birth. This care will help prevent, find, and treat any problems during the pregnancy and childbirth. BODY CHANGES Your body goes through many changes during pregnancy. The changes vary from woman to woman.   You may gain or lose a couple of pounds at first.  You may feel sick to your stomach (nauseous) and throw up (vomit). If the vomiting is uncontrollable, call your health care provider.  You may tire easily.  You may develop headaches that can be relieved by medicines approved by your health care provider.  You may urinate more often. Painful urination may mean you have a bladder infection.  You may develop heartburn as a result of your pregnancy.  You may develop constipation because certain hormones are causing the muscles that push waste through your intestines to slow down.  You may develop hemorrhoids or swollen, bulging veins (varicose veins).  Your breasts may begin to grow larger and become tender. Your nipples may stick out more, and the tissue that surrounds them (areola) may become darker.  Your gums may bleed and may be sensitive to brushing and flossing.  Dark spots or blotches  (chloasma, mask of pregnancy) may develop on your face. This will likely fade after the baby is born.  Your menstrual periods will stop.  You may have a loss of appetite.  You may develop cravings for certain kinds of food.  You may have changes in your emotions from day to day, such as being excited to be pregnant or being concerned that something may go wrong with the pregnancy and baby.  You may have more vivid and strange dreams.  You may have changes in your hair. These can include thickening of your hair, rapid growth, and changes in texture. Some women also have hair loss during or after pregnancy, or hair that feels dry or thin. Your hair will most likely return to normal after your baby is born. WHAT TO EXPECT AT YOUR PRENATAL VISITS During a routine prenatal visit:  You will be weighed to make sure you and the baby are growing normally.  Your blood pressure will be taken.  Your abdomen will be measured to track your baby's growth.  The fetal heartbeat will be listened to starting around week 10 or 12 of your pregnancy.  Test results from any previous visits will be discussed. Your health care provider may ask you:  How you are feeling.  If you are feeling the baby move.  If you have had any abnormal symptoms, such as leaking fluid, bleeding, severe headaches, or abdominal cramping.  If you have any questions. Other   tests that may be performed during your first trimester include:  Blood tests to find your blood type and to check for the presence of any previous infections. They will also be used to check for low iron levels (anemia) and Rh antibodies. Later in the pregnancy, blood tests for diabetes will be done along with other tests if problems develop.  Urine tests to check for infections, diabetes, or protein in the urine.  An ultrasound to confirm the proper growth and development of the baby.  An amniocentesis to check for possible genetic problems.  Fetal  screens for spina bifida and Down syndrome.  You may need other tests to make sure you and the baby are doing well. HOME CARE INSTRUCTIONS  Medicines  Follow your health care provider's instructions regarding medicine use. Specific medicines may be either safe or unsafe to take during pregnancy.  Take your prenatal vitamins as directed.  If you develop constipation, try taking a stool softener if your health care provider approves. Diet  Eat regular, well-balanced meals. Choose a variety of foods, such as meat or vegetable-based protein, fish, milk and low-fat dairy products, vegetables, fruits, and whole grain breads and cereals. Your health care provider will help you determine the amount of weight gain that is right for you.  Avoid raw meat and uncooked cheese. These carry germs that can cause birth defects in the baby.  Eating four or five small meals rather than three large meals a day may help relieve nausea and vomiting. If you start to feel nauseous, eating a few soda crackers can be helpful. Drinking liquids between meals instead of during meals also seems to help nausea and vomiting.  If you develop constipation, eat more high-fiber foods, such as fresh vegetables or fruit and whole grains. Drink enough fluids to keep your urine clear or pale yellow. Activity and Exercise  Exercise only as directed by your health care provider. Exercising will help you:  Control your weight.  Stay in shape.  Be prepared for labor and delivery.  Experiencing pain or cramping in the lower abdomen or low back is a good sign that you should stop exercising. Check with your health care provider before continuing normal exercises.  Try to avoid standing for long periods of time. Move your legs often if you must stand in one place for a long time.  Avoid heavy lifting.  Wear low-heeled shoes, and practice good posture.  You may continue to have sex unless your health care provider directs you  otherwise. Relief of Pain or Discomfort  Wear a good support bra for breast tenderness.   Take warm sitz baths to soothe any pain or discomfort caused by hemorrhoids. Use hemorrhoid cream if your health care provider approves.   Rest with your legs elevated if you have leg cramps or low back pain.  If you develop varicose veins in your legs, wear support hose. Elevate your feet for 15 minutes, 3-4 times a day. Limit salt in your diet. Prenatal Care  Schedule your prenatal visits by the twelfth week of pregnancy. They are usually scheduled monthly at first, then more often in the last 2 months before delivery.  Write down your questions. Take them to your prenatal visits.  Keep all your prenatal visits as directed by your health care provider. Safety  Wear your seat belt at all times when driving.  Make a list of emergency phone numbers, including numbers for family, friends, the hospital, and police and fire departments. General   Tips  Ask your health care provider for a referral to a local prenatal education class. Begin classes no later than at the beginning of month 6 of your pregnancy.  Ask for help if you have counseling or nutritional needs during pregnancy. Your health care provider can offer advice or refer you to specialists for help with various needs.  Do not use hot tubs, steam rooms, or saunas.  Do not douche or use tampons or scented sanitary pads.  Do not cross your legs for long periods of time.  Avoid cat litter boxes and soil used by cats. These carry germs that can cause birth defects in the baby and possibly loss of the fetus by miscarriage or stillbirth.  Avoid all smoking, herbs, alcohol, and medicines not prescribed by your health care provider. Chemicals in these affect the formation and growth of the baby.  Schedule a dentist appointment. At home, brush your teeth with a soft toothbrush and be gentle when you floss. SEEK MEDICAL CARE IF:   You have  dizziness.  You have mild pelvic cramps, pelvic pressure, or nagging pain in the abdominal area.  You have persistent nausea, vomiting, or diarrhea.  You have a bad smelling vaginal discharge.  You have pain with urination.  You notice increased swelling in your face, hands, legs, or ankles. SEEK IMMEDIATE MEDICAL CARE IF:   You have a fever.  You are leaking fluid from your vagina.  You have spotting or bleeding from your vagina.  You have severe abdominal cramping or pain.  You have rapid weight gain or loss.  You vomit blood or material that looks like coffee grounds.  You are exposed to German measles and have never had them.  You are exposed to fifth disease or chickenpox.  You develop a severe headache.  You have shortness of breath.  You have any kind of trauma, such as from a fall or a car accident. Document Released: 07/11/2001 Document Revised: 12/01/2013 Document Reviewed: 05/27/2013 [employer] Patient Information 2015 [employer]. This information is not intended to replace advice given to you by your health care provider. Make sure you discuss any questions you have with your health care provider.   Nausea & Vomiting  Have saltine crackers or pretzels by your bed and eat a few bites before you raise your head out of bed in the morning  Eat small frequent meals throughout the day instead of large meals  Drink plenty of fluids throughout the day to stay hydrated, just don't drink a lot of fluids with your meals.  This can make your stomach fill up faster making you feel sick  Do not brush your teeth right after you eat  Products with real ginger are good for nausea, like ginger ale and ginger hard candy Make sure it says made with real ginger!  Sucking on sour candy like lemon heads is also good for nausea  If your prenatal vitamins make you nauseated, take them at night so you will sleep through the nausea  Sea Bands  If you feel like you need  medicine for the nausea & vomiting please let us know  If you are unable to keep any fluids or food down please let us know   Constipation  Drink plenty of fluid, preferably water, throughout the day  Eat foods high in fiber such as fruits, vegetables, and grains  Exercise, such as walking, is a good way to keep your bowels regular  Drink warm fluids, especially warm   prune juice, or decaf coffee  Eat a 1/2 cup of real oatmeal (not instant), 1/2 cup applesauce, and 1/2-1 cup warm prune juice every day  If needed, you may take Colace (docusate sodium) stool softener once or twice a day to help keep the stool soft. If you are pregnant, wait until you are out of your first trimester (12-14 weeks of pregnancy)  If you still are having problems with constipation, you may take Miralax once daily as needed to help keep your bowels regular.  If you are pregnant, wait until you are out of your first trimester (12-14 weeks of pregnancy)  Safe Medications in Pregnancy   Acne: Benzoyl Peroxide Salicylic Acid  Backache/Headache: Tylenol: 2 regular strength every 4 hours OR              2 Extra strength every 6 hours  Colds/Coughs/Allergies: Benadryl (alcohol free) 25 mg every 6 hours as needed Breath right strips Claritin Cepacol throat lozenges Chloraseptic throat spray Cold-Eeze- up to three times per day Cough drops, alcohol free Flonase (by prescription only) Guaifenesin Mucinex Robitussin DM (plain only, alcohol free) Saline nasal spray/drops Sudafed (pseudoephedrine) & Actifed ** use only after [redacted] weeks gestation and if you do not have high blood pressure Tylenol Vicks Vaporub Zinc lozenges Zyrtec   Constipation: Colace Ducolax suppositories Fleet enema Glycerin suppositories Metamucil Milk of magnesia Miralax Senokot Smooth move tea  Diarrhea: Kaopectate Imodium A-D  *NO pepto Bismol  Hemorrhoids: Anusol Anusol HC Preparation  H Tucks  Indigestion: Tums Maalox Mylanta Zantac  Pepcid  Insomnia: Benadryl (alcohol free) 25mg every 6 hours as needed Tylenol PM Unisom, no Gelcaps  Leg Cramps: Tums MagGel  Nausea/Vomiting:  Bonine Dramamine Emetrol Ginger extract Sea bands Meclizine  Nausea medication to take during pregnancy:  Unisom (doxylamine succinate 25 mg tablets) Take one tablet daily at bedtime. If symptoms are not adequately controlled, the dose can be increased to a maximum recommended dose of two tablets daily (1/2 tablet in the morning, 1/2 tablet mid-afternoon and one at bedtime). Vitamin B6 100mg tablets. Take one tablet twice a day (up to 200 mg per day).  Skin Rashes: Aveeno products Benadryl cream or 25mg every 6 hours as needed Calamine Lotion 1% cortisone cream  Yeast infection: Gyne-lotrimin 7 Monistat 7   **If taking multiple medications, please check labels to avoid duplicating the same active ingredients **take medication as directed on the label ** Do not exceed 4000 mg of tylenol in 24 hours **Do not take medications that contain aspirin or ibuprofen      

## 2017-10-11 NOTE — Progress Notes (Signed)
Subjective:    Kerry Ryan is a G1P0000 217w1d being seen today for her first obstetrical visit.  Her obstetrical history is significant for first baby. Dx w/[facility] today.  Pregnancy history fully reviewed. Says BP is high at MDs office for a few years, but thought it was because she was sick.  Patient reports nausea.  Vitals:   10/11/17 1414 10/11/17 1435  BP: (!) 160/110 (!) 154/118  Pulse: 100   Weight: 230 lb (104.3 kg)     HISTORY: OB History  Gravida Para Term Preterm AB Living  1 0 0 0 0 0  SAB TAB Ectopic Multiple Live Births  0 0 0 0 0    # Outcome Date GA Lbr Len/2nd Weight Sex Delivery Anes PTL Lv  1 Current              Past Medical History:  Diagnosis Date  . Acne   . Anxiety   . Chlamydia infection   . Depression   . GERD (gastroesophageal reflux disease)   . Vaginal Pap smear, abnormal    Past Surgical History:  Procedure Laterality Date  . COLPOSCOPY    . LEEP    . NO PAST SURGERIES     Family History  Problem Relation Age of Onset  . Hypertension Mother   . Sarcoidosis Mother   . Cancer Paternal Grandmother        breast  . Other Paternal Grandmother        gangrene  . Other Maternal Grandmother        has a pacemaker  . Irritable bowel syndrome Maternal Grandmother   . Kidney failure Maternal Grandfather   . Other Maternal Grandfather        back problems     Exam       Pelvic Exam:    Perineum: Normal Perineum   Vulva: normal   Vagina:  normal mucosa, normal discharge, no palpable nodules   Uterus Normal, Gravid, FH:  10     Cervix: normal   Adnexa: Not palpable   Urinary:  urethral meatus normal    System:     Skin: normal coloration and turgor, no rashes    Neurologic: oriented, normal, normal mood   Extremities: normal strength, tone, and muscle mass   HEENT PERRLA   Mouth/Teeth mucous membranes moist, normal dentition   Neck supple and no masses   Cardiovascular: regular rate and rhythm   Respiratory:  appears  well, vitals normal, no respiratory distress, acyanotic   Abdomen: soft, non-tender;  FHR: 160 US        The nature [facility] with multiple MDs and other Advanced Practice Providers was explained to patient; also emphasized that residents, students are part of our team.  Assessment:    Pregnancy: G1P0000 Patient Active Problem List   Diagnosis Date Noted  . Supervision of normal first pregnancy 10/11/2017  . Chronic hypertension 10/11/2017  . History of loop electrosurgical excision procedure (LEEP) of cervix affecting pregnancy, antepartum 09/18/2017  . Pregnancy test positive 09/18/2017  . CIN II (cervical intraepithelial neoplasia II) 09/08/2015  . Morbid obesity (HCC) 01/29/2013  . Depression with anxiety 01/29/2013        Plan:     Initial labs drawn.  24 hr protein/CMP added ASA 162 .12 weeks rx labetalol 200mg  BID and phenergan Continue prenatal vitamins  Problem list reviewed and updated  Reviewed n/v relief measures and warning s/s to report  Reviewed recommended weight gain based on pre-gravid BMI  Encouraged well-balanced diet Genetic Screening discussed Integrated Screen: requested.  Ultrasound discussed; fetal survey: requested.  Return in about 3 weeks (around 10/30/2017) for HROB, US:NT+1st IT.  Kerry Ryan 10/11/2017

## 2017-10-12 LAB — COMPREHENSIVE METABOLIC PANEL
A/G RATIO: 1.4 (ref 1.2–2.2)
ALK PHOS: 82 IU/L (ref 39–117)
ALT: 11 IU/L (ref 0–32)
AST: 13 IU/L (ref 0–40)
Albumin: 3.8 g/dL (ref 3.5–5.5)
BUN/Creatinine Ratio: 11 (ref 9–23)
BUN: 7 mg/dL (ref 6–20)
CHLORIDE: 104 mmol/L (ref 96–106)
CO2: 19 mmol/L — ABNORMAL LOW (ref 20–29)
Calcium: 8.9 mg/dL (ref 8.7–10.2)
Creatinine, Ser: 0.61 mg/dL (ref 0.57–1.00)
GFR calc Af Amer: 139 mL/min/{1.73_m2} (ref >59)
GFR calc non Af Amer: 120 mL/min/{1.73_m2} (ref >59)
GLOBULIN, TOTAL: 2.8 g/dL (ref 1.5–4.5)
Glucose: 107 mg/dL — ABNORMAL HIGH (ref 65–99)
POTASSIUM: 4.3 mmol/L (ref 3.5–5.2)
SODIUM: 138 mmol/L (ref 134–144)
Total Protein: 6.6 g/dL (ref 6.0–8.5)

## 2017-10-12 LAB — OBSTETRIC PANEL, INCLUDING HIV
Antibody Screen: NEGATIVE
BASOS ABS: 0 10*3/uL (ref 0.0–0.2)
Basos: 0 %
EOS (ABSOLUTE): 0.1 10*3/uL (ref 0.0–0.4)
Eos: 1 %
HEP B S AG: NEGATIVE
HIV SCREEN 4TH GENERATION: NONREACTIVE
Hematocrit: 36.5 % (ref 34.0–46.6)
Hemoglobin: 12.2 g/dL (ref 11.1–15.9)
IMMATURE GRANULOCYTES: 0 %
Immature Grans (Abs): 0 10*3/uL (ref 0.0–0.1)
Lymphocytes Absolute: 2.3 10*3/uL (ref 0.7–3.1)
Lymphs: 24 %
MCH: 28.4 pg (ref 26.6–33.0)
MCHC: 33.4 g/dL (ref 31.5–35.7)
MCV: 85 fL (ref 79–97)
Monocytes Absolute: 0.5 10*3/uL (ref 0.1–0.9)
Monocytes: 6 %
NEUTROS ABS: 6.7 10*3/uL (ref 1.4–7.0)
NEUTROS PCT: 69 %
PLATELETS: 310 10*3/uL (ref 150–379)
RBC: 4.29 x10E6/uL (ref 3.77–5.28)
RDW: 14.4 % (ref 12.3–15.4)
RPR Ser Ql: NONREACTIVE
RUBELLA: 1.03 {index} (ref >0.99)
Rh Factor: POSITIVE
WBC: 9.6 10*3/uL (ref 3.4–10.8)

## 2017-10-12 LAB — MED LIST OPTION NOT SELECTED

## 2017-10-12 LAB — PMP SCREEN PROFILE (10S), URINE
AMPHETAMINE SCREEN URINE: NEGATIVE ng/mL
BARBITURATE SCREEN URINE: NEGATIVE ng/mL
BENZODIAZEPINE SCREEN, URINE: NEGATIVE ng/mL
CANNABINOIDS UR QL SCN: NEGATIVE ng/mL
Cocaine (Metab) Scrn, Ur: NEGATIVE ng/mL
Creatinine(Crt), U: 114.1 mg/dL (ref 20.0–300.0)
Methadone Screen, Urine: NEGATIVE ng/mL
OPIATE SCREEN URINE: NEGATIVE ng/mL
OXYCODONE+OXYMORPHONE UR QL SCN: NEGATIVE ng/mL
PHENCYCLIDINE QUANTITATIVE URINE: NEGATIVE ng/mL
Ph of Urine: 5.5 (ref 4.5–8.9)
Propoxyphene Scrn, Ur: NEGATIVE ng/mL

## 2017-10-12 LAB — URINALYSIS, ROUTINE W REFLEX MICROSCOPIC
BILIRUBIN UA: NEGATIVE
Glucose, UA: NEGATIVE
KETONES UA: NEGATIVE
Leukocytes, UA: NEGATIVE
Nitrite, UA: NEGATIVE
PH UA: 5.5 (ref 5.0–7.5)
PROTEIN UA: NEGATIVE
RBC UA: NEGATIVE
SPEC GRAV UA: 1.019 (ref 1.005–1.030)
UUROB: 0.2 mg/dL (ref 0.2–1.0)

## 2017-10-14 LAB — URINE CULTURE

## 2017-10-17 ENCOUNTER — Encounter: Payer: Self-pay | Admitting: Advanced Practice Midwife

## 2017-10-17 ENCOUNTER — Other Ambulatory Visit: Payer: Self-pay | Admitting: Advanced Practice Midwife

## 2017-10-17 DIAGNOSIS — R8271 Bacteriuria: Secondary | ICD-10-CM | POA: Insufficient documentation

## 2017-10-17 MED ORDER — AMOXICILLIN 500 MG PO CAPS
500.0000 mg | ORAL_CAPSULE | Freq: Three times a day (TID) | ORAL | 0 refills | Status: DC
Start: 2017-10-17 — End: 2017-11-01

## 2017-10-26 LAB — PROTEIN, URINE, 24 HOUR
PROTEIN UR: 16.2 mg/dL
Protein, 24H Urine: 194 mg/24 hr — ABNORMAL HIGH (ref 30–150)

## 2017-11-01 ENCOUNTER — Encounter: Payer: Self-pay | Admitting: Obstetrics & Gynecology

## 2017-11-01 ENCOUNTER — Ambulatory Visit (INDEPENDENT_AMBULATORY_CARE_PROVIDER_SITE_OTHER): Payer: BLUE CROSS/BLUE SHIELD

## 2017-11-01 ENCOUNTER — Ambulatory Visit (INDEPENDENT_AMBULATORY_CARE_PROVIDER_SITE_OTHER): Payer: BLUE CROSS/BLUE SHIELD | Admitting: Obstetrics & Gynecology

## 2017-11-01 VITALS — BP 130/90 | HR 102 | Wt 230.5 lb

## 2017-11-01 DIAGNOSIS — O10911 Unspecified pre-existing hypertension complicating pregnancy, first trimester: Secondary | ICD-10-CM

## 2017-11-01 DIAGNOSIS — Z331 Pregnant state, incidental: Secondary | ICD-10-CM

## 2017-11-01 DIAGNOSIS — Z3A13 13 weeks gestation of pregnancy: Secondary | ICD-10-CM

## 2017-11-01 DIAGNOSIS — F418 Other specified anxiety disorders: Secondary | ICD-10-CM

## 2017-11-01 DIAGNOSIS — I10 Essential (primary) hypertension: Secondary | ICD-10-CM

## 2017-11-01 DIAGNOSIS — Z3401 Encounter for supervision of normal first pregnancy, first trimester: Secondary | ICD-10-CM

## 2017-11-01 DIAGNOSIS — R8271 Bacteriuria: Secondary | ICD-10-CM

## 2017-11-01 DIAGNOSIS — O99341 Other mental disorders complicating pregnancy, first trimester: Secondary | ICD-10-CM

## 2017-11-01 DIAGNOSIS — Z3682 Encounter for antenatal screening for nuchal translucency: Secondary | ICD-10-CM

## 2017-11-01 DIAGNOSIS — Z1389 Encounter for screening for other disorder: Secondary | ICD-10-CM

## 2017-11-01 LAB — POCT URINALYSIS DIPSTICK
Blood, UA: NEGATIVE
GLUCOSE UA: NEGATIVE
Ketones, UA: NEGATIVE
Leukocytes, UA: NEGATIVE
Nitrite, UA: NEGATIVE
Protein, UA: NEGATIVE

## 2017-11-01 NOTE — Progress Notes (Signed)
US 13+1 wks,measurements c/w dates,normal ovaries bilat,NT 1.4 mm,crl 77.05 mm,NB present,fhr 153 bpm,anterior pl gr 0

## 2017-11-01 NOTE — Progress Notes (Signed)
G1P0000 741w1d Estimated Date of Delivery: 05/08/18  Blood pressure 130/90, pulse (!) 102, weight 230 lb 8 oz (104.6 kg), last menstrual period 08/01/2017.   BP weight and urine results all reviewed and noted.  Please refer to the obstetrical flow sheet for the fundal height and fetal heart rate documentation:  Patient reports good fetal movement, denies any bleeding and no rupture of membranes symptoms or regular contractions. Patient is without complaints. All questions were answered.  Orders Placed This Encounter  Procedures  . Integrated 1  . POCT Urinalysis Dipstick    Plan:  Continued routine obstetrical care, NT sonogram is normal, IT done today   Continue labetalol 200 BID, 24 hour urine baseline 194  Return in about 1 month (around 11/29/2017) for HROB.

## 2017-11-03 LAB — INTEGRATED 1
Crown Rump Length: 77.1 mm
Gest. Age on Collection Date: 13.4 weeks
Maternal Age at EDD: 32.6 yr
NUCHAL TRANSLUCENCY (NT): 1.4 mm
Number of Fetuses: 1
PAPP-A VALUE: 914.7 ng/mL
Weight: 230 [lb_av]

## 2017-11-29 ENCOUNTER — Ambulatory Visit (INDEPENDENT_AMBULATORY_CARE_PROVIDER_SITE_OTHER): Payer: BLUE CROSS/BLUE SHIELD | Admitting: Advanced Practice Midwife

## 2017-11-29 ENCOUNTER — Encounter: Payer: Self-pay | Admitting: Advanced Practice Midwife

## 2017-11-29 VITALS — BP 120/80 | HR 98 | Wt 232.0 lb

## 2017-11-29 DIAGNOSIS — Z3A17 17 weeks gestation of pregnancy: Secondary | ICD-10-CM

## 2017-11-29 DIAGNOSIS — I10 Essential (primary) hypertension: Secondary | ICD-10-CM

## 2017-11-29 DIAGNOSIS — Z331 Pregnant state, incidental: Secondary | ICD-10-CM

## 2017-11-29 DIAGNOSIS — O26892 Other specified pregnancy related conditions, second trimester: Secondary | ICD-10-CM

## 2017-11-29 DIAGNOSIS — R102 Pelvic and perineal pain: Secondary | ICD-10-CM

## 2017-11-29 DIAGNOSIS — O0992 Supervision of high risk pregnancy, unspecified, second trimester: Secondary | ICD-10-CM

## 2017-11-29 DIAGNOSIS — Z1379 Encounter for other screening for genetic and chromosomal anomalies: Secondary | ICD-10-CM

## 2017-11-29 DIAGNOSIS — O10912 Unspecified pre-existing hypertension complicating pregnancy, second trimester: Secondary | ICD-10-CM

## 2017-11-29 DIAGNOSIS — Z1389 Encounter for screening for other disorder: Secondary | ICD-10-CM

## 2017-11-29 DIAGNOSIS — Z363 Encounter for antenatal screening for malformations: Secondary | ICD-10-CM

## 2017-11-29 LAB — POCT URINALYSIS DIPSTICK
Blood, UA: NEGATIVE
GLUCOSE UA: NEGATIVE
LEUKOCYTES UA: NEGATIVE
Nitrite, UA: NEGATIVE
Protein, UA: NEGATIVE

## 2017-11-29 MED ORDER — ASPIRIN EC 81 MG PO TBEC: 162.0000 mg | DELAYED_RELEASE_TABLET | Freq: Every day | ORAL | 6 refills | Status: DC

## 2017-11-29 NOTE — Patient Instructions (Signed)
Kerry Ryan, I greatly value your feedback.  If you receive a survey following your visit with [name] today, we appreciate you taking the time to fill it out.  Thanks, Kerry Ryan, CNM     Second Trimester of Pregnancy The second trimester is from week 14 through week 27 (months 4 through 6). The second trimester is often a time when you feel your best. Your body has adjusted to being pregnant, and you begin to feel better physically. Usually, morning sickness has lessened or quit completely, you may have more energy, and you may have an increase in appetite. The second trimester is also a time when the fetus is growing rapidly. At the end of the sixth month, the fetus is about 9 inches long and weighs about 1 pounds. You will likely begin to feel the baby move (quickening) between 16 and 20 weeks of pregnancy. Body changes during your second trimester Your body continues to go through many changes during your second trimester. The changes vary from woman to woman.  Your weight will continue to increase. You will notice your lower abdomen bulging out.  You may begin to get stretch marks on your hips, abdomen, and breasts.  You may develop headaches that can be relieved by medicines. The medicines should be approved by your health care provider.  You may urinate more often because the fetus is pressing on your bladder.  You may develop or continue to have heartburn as a result of your pregnancy.  You may develop constipation because certain hormones are causing the muscles that push waste through your intestines to slow down.  You may develop hemorrhoids or swollen, bulging veins (varicose veins).  You may have back pain. This is caused by: ? Weight gain. ? Pregnancy hormones that are relaxing the joints in your pelvis. ? A shift in weight and the muscles that support your balance.  Your breasts will continue to grow and they will continue to become tender.  Your gums may  bleed and may be sensitive to brushing and flossing.  Dark spots or blotches (chloasma, mask of pregnancy) may develop on your face. This will likely fade after the baby is born.  A dark line from your belly button to the pubic area (linea nigra) may appear. This will likely fade after the baby is born.  You may have changes in your hair. These can include thickening of your hair, rapid growth, and changes in texture. Some women also have hair loss during or after pregnancy, or hair that feels dry or thin. Your hair will most likely return to normal after your baby is born.  What to expect at prenatal visits During a routine prenatal visit:  You will be weighed to make sure you and the fetus are growing normally.  Your blood pressure will be taken.  Your abdomen will be measured to track your baby's growth.  The fetal heartbeat will be listened to.  Any test results from the previous visit will be discussed.  Your health care provider may ask you:  How you are feeling.  If you are feeling the baby move.  If you have had any abnormal symptoms, such as leaking fluid, bleeding, severe headaches, or abdominal cramping.  If you are using any tobacco products, including cigarettes, chewing tobacco, and electronic cigarettes.  If you have any questions.  Other tests that may be performed during your second trimester include:  Blood tests that check for: ? Low iron levels (anemia). ?  High blood sugar that affects pregnant women (gestational diabetes) between 20 and 28 weeks. ? Rh antibodies. This is to check for a protein on red blood cells (Rh factor).  Urine tests to check for infections, diabetes, or protein in the urine.  An ultrasound to confirm the proper growth and development of the baby.  An amniocentesis to check for possible genetic problems.  Fetal screens for spina bifida and Down syndrome.  HIV (human immunodeficiency virus) testing. Routine prenatal testing  includes screening for HIV, unless you choose not to have this test.  Follow these instructions at home: Medicines  Follow your health care provider's instructions regarding medicine use. Specific medicines may be either safe or unsafe to take during pregnancy.  Take a prenatal vitamin that contains at least 600 micrograms (mcg) of folic acid.  If you develop constipation, try taking a stool softener if your health care provider approves. Eating and drinking  Eat a balanced diet that includes fresh fruits and vegetables, whole grains, good sources of protein such as meat, eggs, or tofu, and low-fat dairy. Your health care provider will help you determine the amount of weight gain that is right for you.  Avoid raw meat and uncooked cheese. These carry germs that can cause birth defects in the baby.  If you have low calcium intake from food, talk to your health care provider about whether you should take a daily calcium supplement.  Limit foods that are high in fat and processed sugars, such as fried and sweet foods.  To prevent constipation: ? Drink enough fluid to keep your urine clear or pale yellow. ? Eat foods that are high in fiber, such as fresh fruits and vegetables, whole grains, and beans. Activity  Exercise only as directed by your health care provider. Most women can continue their usual exercise routine during pregnancy. Try to exercise for 30 minutes at least 5 days a week. Stop exercising if you experience uterine contractions.  Avoid heavy lifting, wear low heel shoes, and practice good posture.  A sexual relationship may be continued unless your health care provider directs you otherwise. Relieving pain and discomfort  Wear a good support bra to prevent discomfort from breast tenderness.  Take warm sitz baths to soothe any pain or discomfort caused by hemorrhoids. Use hemorrhoid cream if your health care provider approves.  Rest with your legs elevated if you have  leg cramps or low back pain.  If you develop varicose veins, wear support hose. Elevate your feet for 15 minutes, 3-4 times a day. Limit salt in your diet. Prenatal Care  Write down your questions. Take them to your prenatal visits.  Keep all your prenatal visits as told by your health care provider. This is important. Safety  Wear your seat belt at all times when driving.  Make a list of emergency phone numbers, including numbers for family, friends, the hospital, and police and fire departments. General instructions  Ask your health care provider for a referral to a local prenatal education class. Begin classes no later than the beginning of month 6 of your pregnancy.  Ask for help if you have counseling or nutritional needs during pregnancy. Your health care provider can offer advice or refer you to specialists for help with various needs.  Do not use hot tubs, steam rooms, or saunas.  Do not douche or use tampons or scented sanitary pads.  Do not cross your legs for long periods of time.  Avoid cat litter boxes and  soil used by cats. These carry germs that can cause birth defects in the baby and possibly loss of the fetus by miscarriage or stillbirth.  Avoid all smoking, herbs, alcohol, and unprescribed drugs. Chemicals in these products can affect the formation and growth of the baby.  Do not use any products that contain nicotine or tobacco, such as cigarettes and e-cigarettes. If you need help quitting, ask your health care provider.  Visit your dentist if you have not gone yet during your pregnancy. Use a soft toothbrush to brush your teeth and be gentle when you floss. Contact a health care provider if:  You have dizziness.  You have mild pelvic cramps, pelvic pressure, or nagging pain in the abdominal area.  You have persistent nausea, vomiting, or diarrhea.  You have a bad smelling vaginal discharge.  You have pain when you urinate. Get help right away if:  You  have a fever.  You are leaking fluid from your vagina.  You have spotting or bleeding from your vagina.  You have severe abdominal cramping or pain.  You have rapid weight gain or weight loss.  You have shortness of breath with chest pain.  You notice sudden or extreme swelling of your face, hands, ankles, feet, or legs.  You have not felt your baby move in over an hour.  You have severe headaches that do not go away when you take medicine.  You have vision changes. Summary  The second trimester is from week 14 through week 27 (months 4 through 6). It is also a time when the fetus is growing rapidly.  Your body goes through many changes during pregnancy. The changes vary from woman to woman.  Avoid all smoking, herbs, alcohol, and unprescribed drugs. These chemicals affect the formation and growth your baby.  Do not use any tobacco products, such as cigarettes, chewing tobacco, and e-cigarettes. If you need help quitting, ask your health care provider.  Contact your health care provider if you have any questions. Keep all prenatal visits as told by your health care provider. This is important. This information is not intended to replace advice given to you by your health care provider. Make sure you discuss any questions you have with your health care provider.      CHILDBIRTH CLASSES [phone] is the phone number for Pregnancy Classes or hospital tours at [place] will be referred to  [email or web address] for more information on childbirth classes  At this site you may register for classes. You may sign up for a waiting list if classes are full. Please SIGN UP FOR THIS!.   When the waiting list becomes long, sometimes new classes can be added.

## 2017-11-29 NOTE — Progress Notes (Signed)
HIGH-RISK PREGNANCY VISIT Patient name: Kerry Ryan MRN [id number]  Date of birth: 31-Mar-1986 Chief Complaint:   High Risk Gestation (2nd IT)  History of Present Illness:   Kerry Ryan is a 32 y.o. G96P0000 female at [redacted]w[redacted]d with an Estimated Date of Delivery: 05/08/18 being seen today for ongoing management of a high-risk pregnancy complicated by chronic HTN.  Today she reports some round ligament pain.  .  .   . denies leaking of fluid.  Review of Systems:   Pertinent items are noted in HPI Denies abnormal vaginal discharge w/ itching/odor/irritation, headaches, visual changes, shortness of breath, chest pain, abdominal pain, severe nausea/vomiting, or problems with urination or bowel movements unless otherwise stated above.    Pertinent History Reviewed:  Medical & Surgical Hx:   Past Medical History:  Diagnosis Date  . Acne   . Anxiety   . Chlamydia infection   . Depression   . GERD (gastroesophageal reflux disease)   . Vaginal Pap smear, abnormal    Past Surgical History:  Procedure Laterality Date  . COLPOSCOPY    . LEEP    . NO PAST SURGERIES     Family History  Problem Relation Age of Onset  . Hypertension Mother   . Sarcoidosis Mother   . Cancer Paternal Grandmother        breast  . Other Paternal Grandmother        gangrene  . Other Maternal Grandmother        has a pacemaker  . Irritable bowel syndrome Maternal Grandmother   . Kidney failure Maternal Grandfather   . Other Maternal Grandfather        back problems    Current Outpatient Medications:  .  fexofenadine (ALLEGRA) 180 MG tablet, Take 180 mg by mouth daily., Disp: , Rfl:  .  labetalol (NORMODYNE) 200 MG tablet, Take 1 tablet (200 mg total) by mouth 2 (two) times daily., Disp: 60 tablet, Rfl: 3 .  Prenatal MV & Min w/FA-DHA (PRENATAL ADULT GUMMY/DHA/FA PO), Take by mouth., Disp: , Rfl:  .  Lactobacillus (REPHRESH PRO-B) CAPS, Take by mouth daily., Disp: , Rfl:  .  omeprazole (PRILOSEC) 20 MG  capsule, Take 1 capsule (20 mg total) by mouth daily. (Patient not taking: Reported on 07/19/2017), Disp: 30 capsule, Rfl: 5 .  promethazine (PHENERGAN) 25 MG tablet, Take 1 tablet (25 mg total) by mouth every 6 (six) hours as needed for nausea or vomiting. (Patient not taking: Reported on 11/01/2017), Disp: 30 tablet, Rfl: 1 Social History: Reviewed -  reports that she has never smoked. She has never used smokeless tobacco.   Physical Assessment:   Vitals:   11/29/17 1337  BP: 120/80  Pulse: 98  Weight: 232 lb (105.2 kg)  Body mass index is 48.49 kg/m.           Physical Examination:   General appearance: alert, well appearing, and in no distress  Mental status: alert, oriented to person, place, and time  Skin: warm & dry   Extremities: Edema: None    Cardiovascular: normal heart rate noted  Respiratory: normal respiratory effort, no distress  Abdomen: gravid, soft, non-tender  Pelvic: Cervical exam deferred         Fetal Status: Fetal Heart Rate (bpm): 149        Fetal Surveillance Testing today: doppler  Results for orders placed or performed in visit on 11/29/17 (from the past 24 hour(s))  POCT urinalysis dipstick   Collection Time: 11/29/17  1:44 PM  Result Value Ref Range   Color, UA     Clarity, UA     Glucose, UA neg    Bilirubin, UA     Ketones, UA moderate    Spec Grav, UA  1.010 - 1.025   Blood, UA neg    pH, UA  5.0 - 8.0   Protein, UA neg    Urobilinogen, UA  0.2 or 1.0 E.U./dL   Nitrite, UA neg    Leukocytes, UA Negative Negative   Appearance     Odor      Assessment & Plan:  1) High-risk pregnancy G1P0000 at [redacted]w[redacted]d with an Estimated Date of Delivery: 05/08/18   2) CHTN, stable  3) ,   Labs/procedures today: 2nd IT  Medications: ASA , Labetalol  BID  Treatment Plan:  Growth u/s @ 20, 24, 28, 32, 35, 38wks    BPP weekly @ 28wks     2x/wk testing nst/sono @ 32wks or weekly BPP    Deliver PRN or 37wks:____   Follow-up: Return in about 2  weeks (around 12/13/2017) for HROB, [id number].  Orders Placed This Encounter  Procedures  . US OB Comp + 14 Wk  . INTEGRATED 2  . POCT urinalysis dipstick   [name] CNM 11/29/2017 1:58 PM

## 2017-12-02 LAB — INTEGRATED 2
AFP MOM: 1.9
Alpha-Fetoprotein: 49.2 ng/mL
Crown Rump Length: 77.1 mm
DIA MoM: 3.16
DIA Value: 413.8 pg/mL
Estriol, Unconjugated: 0.87 ng/mL
GEST. AGE ON COLLECTION DATE: 13.4 wk
GESTATIONAL AGE: 17.4 wk
HCG MOM: 2.71
HCG VALUE: 51.6 [IU]/mL
MATERNAL AGE AT EDD: 32.6 a
Nuchal Translucency (NT): 1.4 mm
Nuchal Translucency MoM: 0.78
Number of Fetuses: 1
PAPP-A MOM: 1.13
PAPP-A VALUE: 914.7 ng/mL
Test Results:: NEGATIVE
WEIGHT: 230 [lb_av]
WEIGHT: 230 [lb_av]
uE3 MoM: 0.92

## 2017-12-13 ENCOUNTER — Encounter: Payer: Self-pay | Admitting: Advanced Practice Midwife

## 2017-12-13 ENCOUNTER — Ambulatory Visit (INDEPENDENT_AMBULATORY_CARE_PROVIDER_SITE_OTHER): Payer: BLUE CROSS/BLUE SHIELD | Admitting: Advanced Practice Midwife

## 2017-12-13 ENCOUNTER — Other Ambulatory Visit: Payer: Self-pay

## 2017-12-13 ENCOUNTER — Ambulatory Visit (INDEPENDENT_AMBULATORY_CARE_PROVIDER_SITE_OTHER): Payer: BLUE CROSS/BLUE SHIELD

## 2017-12-13 VITALS — BP 122/84 | HR 89 | Wt 231.0 lb

## 2017-12-13 DIAGNOSIS — I10 Essential (primary) hypertension: Secondary | ICD-10-CM

## 2017-12-13 DIAGNOSIS — IMO0002 Reserved for concepts with insufficient information to code with codable children: Secondary | ICD-10-CM

## 2017-12-13 DIAGNOSIS — R8271 Bacteriuria: Secondary | ICD-10-CM

## 2017-12-13 DIAGNOSIS — Z3A19 19 weeks gestation of pregnancy: Secondary | ICD-10-CM

## 2017-12-13 DIAGNOSIS — Z363 Encounter for antenatal screening for malformations: Secondary | ICD-10-CM | POA: Diagnosis not present

## 2017-12-13 DIAGNOSIS — Z331 Pregnant state, incidental: Secondary | ICD-10-CM

## 2017-12-13 DIAGNOSIS — O10912 Unspecified pre-existing hypertension complicating pregnancy, second trimester: Secondary | ICD-10-CM

## 2017-12-13 DIAGNOSIS — Z1389 Encounter for screening for other disorder: Secondary | ICD-10-CM

## 2017-12-13 DIAGNOSIS — Z0489 Encounter for examination and observation for other specified reasons: Secondary | ICD-10-CM

## 2017-12-13 DIAGNOSIS — O0992 Supervision of high risk pregnancy, unspecified, second trimester: Secondary | ICD-10-CM

## 2017-12-13 NOTE — Progress Notes (Signed)
HIGH-RISK PREGNANCY VISIT Patient name: Kerry Ryan MRN [id number]  Date of birth: 1985/08/30 Chief Complaint:   High Risk Gestation (u/s today)  History of Present Illness:   Kerry Ryan is a 32 y.o. G96P0000 female at [redacted]w[redacted]d with an Estimated Date of Delivery: 05/08/18 being seen today for ongoing management of a high-risk pregnancy complicated by chronic HTN.  Today she reports no complaints.  . Vag. Bleeding: None.  Movement: Present. denies leaking of fluid. Going on honeymoon to [facility] 6/22 Review of Systems:   Pertinent items are noted in HPI Denies abnormal vaginal discharge w/ itching/odor/irritation, headaches, visual changes, shortness of breath, chest pain, abdominal pain, severe nausea/vomiting, or problems with urination or bowel movements unless otherwise stated above.    Pertinent History Reviewed:  Medical & Surgical Hx:   Past Medical History:  Diagnosis Date  . Acne   . Anxiety   . Chlamydia infection   . Depression   . GERD (gastroesophageal reflux disease)   . Vaginal Pap smear, abnormal    Past Surgical History:  Procedure Laterality Date  . COLPOSCOPY    . LEEP    . NO PAST SURGERIES     Family History  Problem Relation Age of Onset  . Hypertension Mother   . Sarcoidosis Mother   . Cancer Paternal Grandmother        breast  . Other Paternal Grandmother        gangrene  . Other Maternal Grandmother        has a pacemaker  . Irritable bowel syndrome Maternal Grandmother   . Kidney failure Maternal Grandfather   . Other Maternal Grandfather        back problems    Current Outpatient Medications:  .  aspirin EC 81 MG tablet, Take 2 tablets (162 mg total) by mouth daily., Disp: 60 tablet, Rfl: 6 .  fexofenadine (ALLEGRA) 180 MG tablet, Take 180 mg by mouth daily., Disp: , Rfl:  .  omeprazole (PRILOSEC) 20 MG capsule, Take 1 capsule (20 mg total) by mouth daily., Disp: 30 capsule, Rfl: 5 .  Prenatal MV & Min w/FA-DHA (PRENATAL ADULT  GUMMY/DHA/FA PO), Take by mouth., Disp: , Rfl:  .  labetalol (NORMODYNE) 200 MG tablet, Take 1 tablet (200 mg total) by mouth 2 (two) times daily. (Patient not taking: Reported on 12/13/2017), Disp: 60 tablet, Rfl: 3 .  Lactobacillus (REPHRESH PRO-B) CAPS, Take by mouth daily., Disp: , Rfl:  .  promethazine (PHENERGAN) 25 MG tablet, Take 1 tablet (25 mg total) by mouth every 6 (six) hours as needed for nausea or vomiting. (Patient not taking: Reported on 11/01/2017), Disp: 30 tablet, Rfl: 1 Social History: Reviewed -  reports that she has never smoked. She has never used smokeless tobacco.   Physical Assessment:   Vitals:   12/13/17 1343  BP: 122/84  Pulse: 89  Weight: 231 lb (104.8 kg)  Body mass index is 48.28 kg/m.           Physical Examination:   General appearance: alert, well appearing, and in no distress  Mental status: alert, oriented to person, place, and time  Skin: warm & dry   Extremities: Edema: None    Cardiovascular: normal heart rate noted  Respiratory: normal respiratory effort, no distress  Abdomen: gravid, soft, non-tender  Pelvic: Cervical exam deferred         Fetal Status:     Movement: Present    Fetal Surveillance Testing today: [name] 19+1 wks,cephalic,cx  3.6 cm,anterior pl gr 0,ovaries not visualized,bilat adnexa's wnl,svp of fluid 5 cm,fhe 174 bpm,EFW 273 g 42%,pleased have pt come back for additional images of spine and heart (3VV and 3VTV) because of fetal position and pt body habitus,no obvious abnormalities     No results found for this or any previous visit (from the past 24 hour(s)).  Assessment & Plan:  1) High-risk pregnancy G1P0000 at [redacted]w[redacted]d with an Estimated Date of Delivery: 05/08/18   2) CHTN, stable  3) ,   Labs/procedures today:   Medications: labetalol  BID, ASA  QD  Treatment Plan:  Growth u/s @ 20, 28, 34, 38wks   2x/wk testing nst/sono @ 32wks or weekly BPP     Deliver 39 weeks   Follow-up: Return in about 2 weeks (around  12/27/2017) for LROB, US:OB F/U:.  Orders Placed This Encounter  Procedures  . US OB Follow Up  . POCT urinalysis dipstick   [name] CNM 12/13/2017 2:05 PM

## 2017-12-13 NOTE — Addendum Note (Signed)
Addended by: Tish Frederickson A on: 12/13/2017 02:14 PM   Modules accepted: Orders

## 2017-12-13 NOTE — Progress Notes (Signed)
[name] 19+1 wks,cephalic,cx 3.6 cm,anterior pl gr 0,ovaries not visualized,bilat adnexa's wnl,svp of fluid 5 cm,fhe 174 bpm,EFW 273 g 42%,pleased have pt come back for additional images of spine and heart (3VV and 3VTV) because of fetal position and pt body habitus,no obvious abnormalities

## 2017-12-13 NOTE — Patient Instructions (Signed)
Kerry Ryan, I greatly value your feedback.  If you receive a survey following your visit with [name] today, we appreciate you taking the time to fill it out.  Thanks, [name], CNM     Second Trimester of Pregnancy The second trimester is from week 14 through week 27 (months 4 through 6). The second trimester is often a time when you feel your best. Your body has adjusted to being pregnant, and you begin to feel better physically. Usually, morning sickness has lessened or quit completely, you may have more energy, and you may have an increase in appetite. The second trimester is also a time when the fetus is growing rapidly. At the end of the sixth month, the fetus is about 9 inches long and weighs about 1 pounds. You will likely begin to feel the baby move (quickening) between 16 and 20 weeks of pregnancy. Body changes during your second trimester Your body continues to go through many changes during your second trimester. The changes vary from woman to woman.  Your weight will continue to increase. You will notice your lower abdomen bulging out.  You may begin to get stretch marks on your hips, abdomen, and breasts.  You may develop headaches that can be relieved by medicines. The medicines should be approved by your health care provider.  You may urinate more often because the fetus is pressing on your bladder.  You may develop or continue to have heartburn as a result of your pregnancy.  You may develop constipation because certain hormones are causing the muscles that push waste through your intestines to slow down.  You may develop hemorrhoids or swollen, bulging veins (varicose veins).  You may have back pain. This is caused by: ? Weight gain. ? Pregnancy hormones that are relaxing the joints in your pelvis. ? A shift in weight and the muscles that support your balance.  Your breasts will continue to grow and they will continue to become tender.  Your gums may  bleed and may be sensitive to brushing and flossing.  Dark spots or blotches (chloasma, mask of pregnancy) may develop on your face. This will likely fade after the baby is born.  A dark line from your belly button to the pubic area (linea nigra) may appear. This will likely fade after the baby is born.  You may have changes in your hair. These can include thickening of your hair, rapid growth, and changes in texture. Some women also have hair loss during or after pregnancy, or hair that feels dry or thin. Your hair will most likely return to normal after your baby is born.  What to expect at prenatal visits During a routine prenatal visit:  You will be weighed to make sure you and the fetus are growing normally.  Your blood pressure will be taken.  Your abdomen will be measured to track your baby's growth.  The fetal heartbeat will be listened to.  Any test results from the previous visit will be discussed.  Your health care provider may ask you:  How you are feeling.  If you are feeling the baby move.  If you have had any abnormal symptoms, such as leaking fluid, bleeding, severe headaches, or abdominal cramping.  If you are using any tobacco products, including cigarettes, chewing tobacco, and electronic cigarettes.  If you have any questions.  Other tests that may be performed during your second trimester include:  Blood tests that check for: ? Low iron levels (anemia). ?  High blood sugar that affects pregnant women (gestational diabetes) between 20 and 28 weeks. ? Rh antibodies. This is to check for a protein on red blood cells (Rh factor).  Urine tests to check for infections, diabetes, or protein in the urine.  An ultrasound to confirm the proper growth and development of the baby.  An amniocentesis to check for possible genetic problems.  Fetal screens for spina bifida and Down syndrome.  HIV (human immunodeficiency virus) testing. Routine prenatal testing  includes screening for HIV, unless you choose not to have this test.  Follow these instructions at home: Medicines  Follow your health care provider's instructions regarding medicine use. Specific medicines may be either safe or unsafe to take during pregnancy.  Take a prenatal vitamin that contains at least 600 micrograms (mcg) of folic acid.  If you develop constipation, try taking a stool softener if your health care provider approves. Eating and drinking  Eat a balanced diet that includes fresh fruits and vegetables, whole grains, good sources of protein such as meat, eggs, or tofu, and low-fat dairy. Your health care provider will help you determine the amount of weight gain that is right for you.  Avoid raw meat and uncooked cheese. These carry germs that can cause birth defects in the baby.  If you have low calcium intake from food, talk to your health care provider about whether you should take a daily calcium supplement.  Limit foods that are high in fat and processed sugars, such as fried and sweet foods.  To prevent constipation: ? Drink enough fluid to keep your urine clear or pale yellow. ? Eat foods that are high in fiber, such as fresh fruits and vegetables, whole grains, and beans. Activity  Exercise only as directed by your health care provider. Most women can continue their usual exercise routine during pregnancy. Try to exercise for 30 minutes at least 5 days a week. Stop exercising if you experience uterine contractions.  Avoid heavy lifting, wear low heel shoes, and practice good posture.  A sexual relationship may be continued unless your health care provider directs you otherwise. Relieving pain and discomfort  Wear a good support bra to prevent discomfort from breast tenderness.  Take warm sitz baths to soothe any pain or discomfort caused by hemorrhoids. Use hemorrhoid cream if your health care provider approves.  Rest with your legs elevated if you have  leg cramps or low back pain.  If you develop varicose veins, wear support hose. Elevate your feet for 15 minutes, 3-4 times a day. Limit salt in your diet. Prenatal Care  Write down your questions. Take them to your prenatal visits.  Keep all your prenatal visits as told by your health care provider. This is important. Safety  Wear your seat belt at all times when driving.  Make a list of emergency phone numbers, including numbers for family, friends, the hospital, and police and fire departments. General instructions  Ask your health care provider for a referral to a local prenatal education class. Begin classes no later than the beginning of month 6 of your pregnancy.  Ask for help if you have counseling or nutritional needs during pregnancy. Your health care provider can offer advice or refer you to specialists for help with various needs.  Do not use hot tubs, steam rooms, or saunas.  Do not douche or use tampons or scented sanitary pads.  Do not cross your legs for long periods of time.  Avoid cat litter boxes and  soil used by cats. These carry germs that can cause birth defects in the baby and possibly loss of the fetus by miscarriage or stillbirth.  Avoid all smoking, herbs, alcohol, and unprescribed drugs. Chemicals in these products can affect the formation and growth of the baby.  Do not use any products that contain nicotine or tobacco, such as cigarettes and e-cigarettes. If you need help quitting, ask your health care provider.  Visit your dentist if you have not gone yet during your pregnancy. Use a soft toothbrush to brush your teeth and be gentle when you floss. Contact a health care provider if:  You have dizziness.  You have mild pelvic cramps, pelvic pressure, or nagging pain in the abdominal area.  You have persistent nausea, vomiting, or diarrhea.  You have a bad smelling vaginal discharge.  You have pain when you urinate. Get help right away if:  You  have a fever.  You are leaking fluid from your vagina.  You have spotting or bleeding from your vagina.  You have severe abdominal cramping or pain.  You have rapid weight gain or weight loss.  You have shortness of breath with chest pain.  You notice sudden or extreme swelling of your face, hands, ankles, feet, or legs.  You have not felt your baby move in over an hour.  You have severe headaches that do not go away when you take medicine.  You have vision changes. Summary  The second trimester is from week 14 through week 27 (months 4 through 6). It is also a time when the fetus is growing rapidly.  Your body goes through many changes during pregnancy. The changes vary from woman to woman.  Avoid all smoking, herbs, alcohol, and unprescribed drugs. These chemicals affect the formation and growth your baby.  Do not use any tobacco products, such as cigarettes, chewing tobacco, and e-cigarettes. If you need help quitting, ask your health care provider.  Contact your health care provider if you have any questions. Keep all prenatal visits as told by your health care provider. This is important. This information is not intended to replace advice given to you by your health care provider. Make sure you discuss any questions you have with your health care provider.      CHILDBIRTH CLASSES [phone] is the phone number for Pregnancy Classes or hospital tours at [place] will be referred to  [email or web address] for more information on childbirth classes  At this site you may register for classes. You may sign up for a waiting list if classes are full. Please SIGN UP FOR THIS!.   When the waiting list becomes long, sometimes new classes can be added.

## 2017-12-27 ENCOUNTER — Ambulatory Visit (INDEPENDENT_AMBULATORY_CARE_PROVIDER_SITE_OTHER): Payer: BLUE CROSS/BLUE SHIELD | Admitting: Obstetrics and Gynecology

## 2017-12-27 ENCOUNTER — Ambulatory Visit (INDEPENDENT_AMBULATORY_CARE_PROVIDER_SITE_OTHER): Payer: BLUE CROSS/BLUE SHIELD

## 2017-12-27 ENCOUNTER — Encounter: Payer: Self-pay | Admitting: Obstetrics and Gynecology

## 2017-12-27 VITALS — BP 130/90 | HR 99 | Wt 234.4 lb

## 2017-12-27 DIAGNOSIS — O99342 Other mental disorders complicating pregnancy, second trimester: Secondary | ICD-10-CM

## 2017-12-27 DIAGNOSIS — IMO0002 Reserved for concepts with insufficient information to code with codable children: Secondary | ICD-10-CM

## 2017-12-27 DIAGNOSIS — I10 Essential (primary) hypertension: Secondary | ICD-10-CM

## 2017-12-27 DIAGNOSIS — Z3A21 21 weeks gestation of pregnancy: Secondary | ICD-10-CM

## 2017-12-27 DIAGNOSIS — R8271 Bacteriuria: Secondary | ICD-10-CM

## 2017-12-27 DIAGNOSIS — O9989 Other specified diseases and conditions complicating pregnancy, childbirth and the puerperium: Secondary | ICD-10-CM

## 2017-12-27 DIAGNOSIS — F329 Major depressive disorder, single episode, unspecified: Secondary | ICD-10-CM | POA: Diagnosis not present

## 2017-12-27 DIAGNOSIS — Z0489 Encounter for examination and observation for other specified reasons: Secondary | ICD-10-CM

## 2017-12-27 DIAGNOSIS — M545 Low back pain: Secondary | ICD-10-CM

## 2017-12-27 DIAGNOSIS — O10912 Unspecified pre-existing hypertension complicating pregnancy, second trimester: Secondary | ICD-10-CM

## 2017-12-27 DIAGNOSIS — O0992 Supervision of high risk pregnancy, unspecified, second trimester: Secondary | ICD-10-CM

## 2017-12-27 DIAGNOSIS — Z1389 Encounter for screening for other disorder: Secondary | ICD-10-CM

## 2017-12-27 DIAGNOSIS — Z331 Pregnant state, incidental: Secondary | ICD-10-CM

## 2017-12-27 LAB — POCT URINALYSIS DIPSTICK
GLUCOSE UA: NEGATIVE
KETONES UA: NEGATIVE
Leukocytes, UA: NEGATIVE
Nitrite, UA: NEGATIVE
Protein, UA: NEGATIVE
RBC UA: NEGATIVE

## 2017-12-27 NOTE — Progress Notes (Signed)
Patient ID: Kerry Ryan, female   DOB: 01/10/1986, 32 y.o.   MRN: [id number]   [facility] PREGNANCY VISIT Patient name: Kerry Ryan MRN [id number]  Date of birth: [birth year] Chief Complaint:   high risk ob (ultrasound/ back pain)  History of Present Illness:   Kerry Ryan is a 32 y.o. G70P0000 female at [redacted]w[redacted]d with an Estimated Date of Delivery: 05/08/18 being seen today for ongoing management of a high-risk pregnancy complicated by chronic HTN.  Today she reports low back pain. The baby's father is present today. It will be his third child. She works for [employer] and is able to get about 8-10,000 steps a day.  Contractions: Not present.  .  Movement: Present. denies leaking of fluid.  Review of Systems:   Pertinent items are noted in HPI Denies abnormal vaginal discharge w/ itching/odor/irritation, headaches, visual changes, shortness of breath, chest pain, abdominal pain, severe nausea/vomiting, or problems with urination or bowel movements unless otherwise stated above. Pertinent History Reviewed:  Reviewed past medical,surgical, social, obstetrical and family history.  Reviewed problem list, medications and allergies. Physical Assessment:   Vitals:   12/27/17 1433  BP: 130/90  Pulse: 99  Weight: 234 lb 6.4 oz (106.3 kg)  Body mass index is 48.99 kg/m. height 4'10"  Wt Readings from Last 3 Encounters:  12/27/17 234 lb 6.4 oz (106.3 kg)  12/13/17 231 lb (104.8 kg)  11/29/17 232 lb (105.2 kg)         Physical Examination: BP 130/90   Pulse 99   Wt 234 lb 6.4 oz (106.3 kg)   LMP 08/01/2017   BMI 48.99 kg/m    General appearance: alert, well appearing, and in no distress, oriented to person, place, and time and overweight  Mental status: alert, oriented to person, place, and time, normal mood, behavior, speech, dress, motor activity, and thought processes, affect appropriate to mood  Skin: warm & dry   Extremities: Edema: None    Cardiovascular: normal heart rate  noted  Respiratory: normal respiratory effort, no distress  Abdomen: gravid, soft, non-tender  Pelvic: Cervical exam deferred         Fetal Status: Fetal Heart Rate (bpm): 161 Fundal Height: 22 cm Movement: Present    Fetal Surveillance Testing today: [name] 21+1 wks,cephalic,anterior pl gr 0,normal ovaries bilat,cx 4.6 cm,svp of fluid 4.5 cm,fhr 161 bpm,anatomy of the heart and spine are complete,no obvious abnormalities    Results for orders placed or performed in visit on 12/27/17 (from the past 24 hour(s))  POCT urinalysis dipstick   Collection Time: 12/27/17  2:38 PM  Result Value Ref Range   Color, UA     Clarity, UA     Glucose, UA Negative Negative   Bilirubin, UA     Ketones, UA neg    Spec Grav, UA  1.010 - 1.025   Blood, UA neg    pH, UA  5.0 - 8.0   Protein, UA Negative Negative   Urobilinogen, UA  0.2 or 1.0 E.U./dL   Nitrite, UA neg    Leukocytes, UA Negative Negative   Appearance     Odor      Assessment & Plan:  1) High-risk pregnancy G1P0000 at [redacted]w[redacted]d with an Estimated Date of Delivery: 05/08/18   2) Chronic HTN, stable, labetalol  BID, ASA  QD  Meds: No orders of the defined types were placed in this encounter.   Labs/procedures today: U/S  Treatment Plan:   1. Growth u/s @ 28, 34,  38wks   2. Deliver 39 weeks   Follow-up: Return in about 1 month (around 01/24/2018) for HROB.  Orders Placed This Encounter  Procedures  . POCT urinalysis dipstick   By signing my name below, I, Diona Browner, attest that this documentation has been prepared under the direction and in the presence of Tilda Burrow, MD. Electronically Signed: Diona Browner, Medical Scribe. 12/27/17. 3:05 PM.  I personally performed the services described in this documentation, which was SCRIBED in my presence. The recorded information has been reviewed and considered accurate. It has been edited as necessary during review. Tilda Burrow, MD

## 2017-12-27 NOTE — Patient Instructions (Signed)
(  [phone] is the phone number for Pregnancy Classes or hospital tours at [facility].  ° °You will be referred to  [email or web address]   for more information on childbirth classes   °At this site you may register for classes. You may sign up for a waiting list if classes are full. Please SIGN UP FOR THIS!.   When the waiting list becomes long, sometimes new classes can be added. ° ° ° °

## 2017-12-27 NOTE — Progress Notes (Signed)
[name] 21+1 wks,cephalic,anterior pl gr 0,normal ovaries bilat,cx 4.6 cm,svp of fluid 4.5 cm,fhr 161 bpm,anatomy of the heart and spine are complete,no obvious abnormalities

## 2018-01-18 ENCOUNTER — Ambulatory Visit: Payer: BLUE CROSS/BLUE SHIELD | Admitting: Family

## 2018-01-25 ENCOUNTER — Ambulatory Visit (INDEPENDENT_AMBULATORY_CARE_PROVIDER_SITE_OTHER): Payer: BLUE CROSS/BLUE SHIELD | Admitting: Obstetrics and Gynecology

## 2018-01-25 ENCOUNTER — Encounter: Payer: Self-pay | Admitting: Obstetrics and Gynecology

## 2018-01-25 VITALS — BP 132/84 | HR 92 | Wt 233.6 lb

## 2018-01-25 DIAGNOSIS — O10012 Pre-existing essential hypertension complicating pregnancy, second trimester: Secondary | ICD-10-CM

## 2018-01-25 DIAGNOSIS — Z3A25 25 weeks gestation of pregnancy: Secondary | ICD-10-CM

## 2018-01-25 DIAGNOSIS — Z331 Pregnant state, incidental: Secondary | ICD-10-CM

## 2018-01-25 DIAGNOSIS — Z1389 Encounter for screening for other disorder: Secondary | ICD-10-CM

## 2018-01-25 DIAGNOSIS — O0992 Supervision of high risk pregnancy, unspecified, second trimester: Secondary | ICD-10-CM

## 2018-01-25 LAB — POCT URINALYSIS DIPSTICK
GLUCOSE UA: NEGATIVE
KETONES UA: NEGATIVE
LEUKOCYTES UA: NEGATIVE
NITRITE UA: NEGATIVE
Protein, UA: NEGATIVE
RBC UA: NEGATIVE

## 2018-01-25 NOTE — Progress Notes (Signed)
Patient ID: [name], female   DOB: [birth year], 32 y.o.   MRN: [id number]    [facility] PREGNANCY VISIT Patient name: Kerry Ryan MRN [id number]  Date of birth: Dec 15, 1985 Chief Complaint:   High Risk Gestation (ribs hurt; some cramping; tight in belly )  History of Present Illness:   Kerry Ryan is a 32 y.o. G96P0000 female at [redacted]w[redacted]d with an Estimated Date of Delivery: 05/08/18 being seen today for ongoing management of a high-risk pregnancy complicated by chronic HTN. She takes Labetalol 200mg  BID, ASA 162mg  QD. Today she reports rib pain when she is walking that started while she was on her honeymoon last week. She also notes come cramping and tightness in her stomach. Her weight has not changed since her last visit. She works outside loading groceries into cars. She plans on doing childbirth classes.   Contractions: Irregular. Vag. Bleeding: None.  Movement: Present. denies leaking of fluid.  Review of Systems:   Pertinent items are noted in HPI Denies abnormal vaginal discharge w/ itching/odor/irritation, headaches, visual changes, shortness of breath, chest pain, abdominal pain, severe nausea/vomiting, or problems with urination or bowel movements unless otherwise stated above. Pertinent History Reviewed:  Reviewed past medical,surgical, social, obstetrical and family history.  Reviewed problem list, medications and allergies. Physical Assessment:   Vitals:   01/25/18 1237  BP: 132/84  Pulse: 92  Weight: 233 lb 9.6 oz (106 kg)  Body mass index is 48.82 kg/m.           Physical Examination:   General appearance: alert, well appearing, and in no distress and oriented to person, place, and time  Mental status: alert, oriented to person, place, and time, normal mood, behavior, speech, dress, motor activity, and thought processes, affect appropriate to mood  Skin: warm & dry   Extremities: Edema: None    Cardiovascular: normal heart rate noted  Respiratory: normal  respiratory effort, no distress  Abdomen: gravid, soft, non-tender  Pelvic: Cervical exam deferred         Fetal Status:     Movement: Present    Fetal Surveillance Testing today: none   Results for orders placed or performed in visit on 01/25/18 (from the past 24 hour(s))  POCT urinalysis dipstick   Collection Time: 01/25/18 12:38 PM  Result Value Ref Range   Color, UA     Clarity, UA     Glucose, UA Negative Negative   Bilirubin, UA     Ketones, UA neg    Spec Grav, UA  1.010 - 1.025   Blood, UA neg    pH, UA  5.0 - 8.0   Protein, UA Negative Negative   Urobilinogen, UA  0.2 or 1.0 E.U./dL   Nitrite, UA neg    Leukocytes, UA Negative Negative   Appearance     Odor      Assessment & Plan:  1) High-risk pregnancy G1P0000 at [redacted]w[redacted]d with an Estimated Date of Delivery: 05/08/18   2) Chronic HTN, stable, labetalol 200mg  BID, ASA 162mg  QD,   Meds: No orders of the defined types were placed in this encounter.  Labs/procedures today: none  Treatment Plan:  1) Deliver at 39 weeks 2) PN2 in 2 weeks 3) Continue Labetalol 200mg  BID 4 strongly encouraged to stay active, walk, get step counter, use friend's pool etc. Follow-up: Return in about 2 weeks (around 02/08/2018) for PN-2 Labs, HROB.  Orders Placed This Encounter  Procedures  . POCT urinalysis dipstick   By signing my  name below, I, Pietro Cassismily Tufford, attest that this documentation has been prepared under the direction and in the presence of Tilda BurrowFerguson, Clayburn Weekly V, MD. Electronically Signed: Pietro CassisEmily Tufford, Medical Scribe. 01/25/18. 1:04 PM.  I personally performed the services described in this documentation, which was SCRIBED in my presence. The recorded information has been reviewed and considered accurate. It has been edited as necessary during review. Tilda BurrowJohn V Carlia Bomkamp, MD

## 2018-02-06 ENCOUNTER — Other Ambulatory Visit: Payer: BLUE CROSS/BLUE SHIELD

## 2018-02-06 ENCOUNTER — Ambulatory Visit (INDEPENDENT_AMBULATORY_CARE_PROVIDER_SITE_OTHER): Payer: BLUE CROSS/BLUE SHIELD | Admitting: Advanced Practice Midwife

## 2018-02-06 ENCOUNTER — Other Ambulatory Visit (HOSPITAL_COMMUNITY)
Admission: RE | Admit: 2018-02-06 | Discharge: 2018-02-06 | Disposition: A | Discharge status: Home / Self Care | Payer: BLUE CROSS/BLUE SHIELD | Source: Ambulatory Visit | Attending: Advanced Practice Midwife | Admitting: Advanced Practice Midwife

## 2018-02-06 VITALS — BP 137/91 | HR 88 | Wt 233.5 lb

## 2018-02-06 DIAGNOSIS — I10 Essential (primary) hypertension: Secondary | ICD-10-CM

## 2018-02-06 DIAGNOSIS — Z131 Encounter for screening for diabetes mellitus: Secondary | ICD-10-CM

## 2018-02-06 DIAGNOSIS — Z3A27 27 weeks gestation of pregnancy: Secondary | ICD-10-CM | POA: Diagnosis not present

## 2018-02-06 DIAGNOSIS — Z1389 Encounter for screening for other disorder: Secondary | ICD-10-CM

## 2018-02-06 DIAGNOSIS — O0992 Supervision of high risk pregnancy, unspecified, second trimester: Secondary | ICD-10-CM

## 2018-02-06 DIAGNOSIS — O3442 Maternal care for other abnormalities of cervix, second trimester: Secondary | ICD-10-CM | POA: Insufficient documentation

## 2018-02-06 DIAGNOSIS — O3440 Maternal care for other abnormalities of cervix, unspecified trimester: Secondary | ICD-10-CM

## 2018-02-06 DIAGNOSIS — Z9889 Other specified postprocedural states: Secondary | ICD-10-CM | POA: Insufficient documentation

## 2018-02-06 DIAGNOSIS — Z331 Pregnant state, incidental: Secondary | ICD-10-CM

## 2018-02-06 DIAGNOSIS — Z23 Encounter for immunization: Secondary | ICD-10-CM

## 2018-02-06 DIAGNOSIS — O10912 Unspecified pre-existing hypertension complicating pregnancy, second trimester: Secondary | ICD-10-CM

## 2018-02-06 LAB — POCT URINALYSIS DIPSTICK
GLUCOSE UA: NEGATIVE
Nitrite, UA: NEGATIVE
Protein, UA: NEGATIVE
RBC UA: NEGATIVE

## 2018-02-06 LAB — OB RESULTS CONSOLE GC/CHLAMYDIA: GC PROBE AMP, GENITAL: NEGATIVE

## 2018-02-06 NOTE — Progress Notes (Signed)
HIGH-RISK PREGNANCY VISIT Patient name: Kerry Ryan MRN [id number]  Date of birth: 1986-06-29 Chief Complaint:   Routine Prenatal Visit (PN2)  History of Present Illness:   Kerry Ryan is a 31 y.o. G83P0000 female at [redacted]w[redacted]d with an Estimated Date of Delivery: 05/08/18 being seen today for ongoing management of a high-risk pregnancy complicated by chronic HTN.  Today she reports no complaints. Contractions: Not present. Vag. Bleeding: None.  Movement: Present. denies leaking of fluid. Hasn't taken BP meds this am  Review of Systems:   Pertinent items are noted in HPI Denies abnormal vaginal discharge w/ itching/odor/irritation, headaches, visual changes, shortness of breath, chest pain, abdominal pain, severe nausea/vomiting, or problems with urination or bowel movements unless otherwise stated above.    Pertinent History Reviewed:  Medical & Surgical Hx:   Past Medical History:  Diagnosis Date  . Acne   . Anxiety   . Chlamydia infection   . Depression   . GERD (gastroesophageal reflux disease)   . Vaginal Pap smear, abnormal    Past Surgical History:  Procedure Laterality Date  . COLPOSCOPY    . LEEP    . NO PAST SURGERIES     Family History  Problem Relation Age of Onset  . Hypertension Mother   . Sarcoidosis Mother   . Cancer Paternal Grandmother        breast  . Other Paternal Grandmother        gangrene  . Other Maternal Grandmother        has a pacemaker  . Irritable bowel syndrome Maternal Grandmother   . Kidney failure Maternal Grandfather   . Other Maternal Grandfather        back problems    Current Outpatient Medications:  .  aspirin EC 81 MG tablet, Take 2 tablets (162 mg total) by mouth daily., Disp: 60 tablet, Rfl: 6 .  fexofenadine (ALLEGRA) 180 MG tablet, Take 180 mg by mouth daily., Disp: , Rfl:  .  labetalol (NORMODYNE) 200 MG tablet, Take 1 tablet (200 mg total) by mouth 2 (two) times daily., Disp: 60 tablet, Rfl: 3 .  omeprazole (PRILOSEC)  20 MG capsule, Take 1 capsule (20 mg total) by mouth daily., Disp: 30 capsule, Rfl: 5 .  Prenatal MV & Min w/FA-DHA (PRENATAL ADULT GUMMY/DHA/FA PO), Take by mouth., Disp: , Rfl:  .  promethazine (PHENERGAN) 25 MG tablet, Take 1 tablet (25 mg total) by mouth every 6 (six) hours as needed for nausea or vomiting., Disp: 30 tablet, Rfl: 1 Social History: Reviewed -  reports that she has never smoked. She has never used smokeless tobacco.   Physical Assessment:   Vitals:   02/06/18 0901  BP: (!) 137/91  Pulse: 88  Weight: 233 lb 8 oz (105.9 kg)  Body mass index is 48.8 kg/m.           Physical Examination:   General appearance: alert, well appearing, and in no distress  Mental status: alert, oriented to person, place, and time  Skin: warm & dry   Extremities: Edema: None    Cardiovascular: normal heart rate noted  Respiratory: normal respiratory effort, no distress  Abdomen: gravid, soft, non-tender  Pelvic: Cervical exam deferred         Fetal Status:     Movement: Present    Fetal Surveillance Testing today: doppler  No results found for this or any previous visit (from the past 24 hour(s)).  Assessment & Plan:  1) High-risk pregnancy G1P0000 at  6422w0d with an Estimated Date of Delivery: 05/08/18   2) CHTN, stable  3) ,   Labs/procedures today: PN2, Tdap, Pap  Medications: labetalol 200mg  BID, ASA 162mg  qd  Treatment Plan:  Growth u/s @  28, 34, 38wks 2x/wk testing nst/sono @ 32wks  Deliver 39 weeks    Follow-up: Return for EFW only n 10-14 days; HROB 3 weeks.  Orders Placed This Encounter  Procedures  . US OB Follow Up  . Tdap vaccine greater than or equal to 7yo IM  . POCT Urinalysis Dipstick   Jacklyn ShellFrances Cresenzo-Dishmon CNM 02/06/2018 9:45 AM

## 2018-02-06 NOTE — Patient Instructions (Signed)
Kerry Ryan, I greatly value your feedback.  If you receive a survey following your visit with us today, we appreciate you taking the time to fill it out.  Thanks, Kerry Ryan, CNM   Call the office [phone]) or go to [facility] if:  You begin to have strong, frequent contractions  Your water breaks.  Sometimes it is a big gush of fluid, sometimes it is just a trickle that keeps getting your panties wet or running down your legs  You have vaginal bleeding.  It is normal to have a small amount of spotting if your cervix was checked.   You don't feel your baby moving like normal.  If you don't, get you something to eat and drink and lay down and focus on feeling your baby move.  You should feel at least 10 movements in 2 hours.  If you don't, you should call the office or [facility] to [facility].    Tdap Vaccine  It is recommended that you get the Tdap vaccine during the third trimester of EACH pregnancy to help protect your baby from getting pertussis (whooping cough)  27-36 weeks is the BEST time to do this so that you can pass the protection on to your baby. During pregnancy is better than after pregnancy, but if you are unable to get it during pregnancy it will be offered at the hospital.   You will be offered this vaccine in the office after 27 weeks. If you do not have health insurance, you can get this vaccine at the health department or your family doctor  Everyone who will be around your baby should also be up-to-date on their vaccines. Adults (who are not pregnant) only need 1 dose of Tdap during adulthood.   Third Trimester of Pregnancy The third trimester is from week 29 through week 42, months 7 through 9. The third trimester is a time when the fetus is growing rapidly. At the end of the ninth month, the fetus is about 20 inches in length and weighs 6-10 pounds.  BODY CHANGES Your body goes through many changes during pregnancy. The changes vary from woman  to woman.   Your weight will continue to increase. You can expect to gain 25-35 pounds (11-16 kg) by the end of the pregnancy.  You may begin to get stretch marks on your hips, abdomen, and breasts.  You may urinate more often because the fetus is moving lower into your pelvis and pressing on your bladder.  You may develop or continue to have heartburn as a result of your pregnancy.  You may develop constipation because certain hormones are causing the muscles that push waste through your intestines to slow down.  You may develop hemorrhoids or swollen, bulging veins (varicose veins).  You may have pelvic pain because of the weight gain and pregnancy hormones relaxing your joints between the bones in your pelvis. Backaches may result from overexertion of the muscles supporting your posture.  You may have changes in your hair. These can include thickening of your hair, rapid growth, and changes in texture. Some women also have hair loss during or after pregnancy, or hair that feels dry or thin. Your hair will most likely return to normal after your baby is born.  Your breasts will continue to grow and be tender. A yellow discharge may leak from your breasts called colostrum.  Your belly button may stick out.  You may feel short of breath because of your expanding uterus.  You may notice the fetus "dropping," or moving lower in your abdomen.  You may have a bloody mucus discharge. This usually occurs a few days to a week before labor begins.  Your cervix becomes thin and soft (effaced) near your due date. WHAT TO EXPECT AT YOUR PRENATAL EXAMS  You will have prenatal exams every 2 weeks until week 36. Then, you will have weekly prenatal exams. During a routine prenatal visit:  You will be weighed to make sure you and the fetus are growing normally.  Your blood pressure is taken.  Your abdomen will be measured to track your baby's growth.  The fetal heartbeat will be listened  to.  Any test results from the previous visit will be discussed.  You may have a cervical check near your due date to see if you have effaced. At around 36 weeks, your caregiver will check your cervix. At the same time, your caregiver will also perform a test on the secretions of the vaginal tissue. This test is to determine if a type of bacteria, Group B streptococcus, is present. Your caregiver will explain this further. Your caregiver may ask you:  What your birth plan is.  How you are feeling.  If you are feeling the baby move.  If you have had any abnormal symptoms, such as leaking fluid, bleeding, severe headaches, or abdominal cramping.  If you have any questions. Other tests or screenings that may be performed during your third trimester include:  Blood tests that check for low iron levels (anemia).  Fetal testing to check the health, activity level, and growth of the fetus. Testing is done if you have certain medical conditions or if there are problems during the pregnancy. FALSE LABOR You may feel small, irregular contractions that eventually go away. These are called Braxton Hicks contractions, or false labor. Contractions may last for hours, days, or even weeks before true labor sets in. If contractions come at regular intervals, intensify, or become painful, it is best to be seen by your caregiver.  SIGNS OF LABOR   Menstrual-like cramps.  Contractions that are 5 minutes apart or less.  Contractions that start on the top of the uterus and spread down to the lower abdomen and back.  A sense of increased pelvic pressure or back pain.  A watery or bloody mucus discharge that comes from the vagina. If you have any of these signs before the 37th week of pregnancy, call your caregiver right away. You need to go to the hospital to get checked immediately. HOME CARE INSTRUCTIONS   Avoid all smoking, herbs, alcohol, and unprescribed drugs. These chemicals affect the  formation and growth of the baby.  Follow your caregiver's instructions regarding medicine use. There are medicines that are either safe or unsafe to take during pregnancy.  Exercise only as directed by your caregiver. Experiencing uterine cramps is a good sign to stop exercising.  Continue to eat regular, healthy meals.  Wear a good support bra for breast tenderness.  Do not use hot tubs, steam rooms, or saunas.  Wear your seat belt at all times when driving.  Avoid raw meat, uncooked cheese, cat litter boxes, and soil used by cats. These carry germs that can cause birth defects in the baby.  Take your prenatal vitamins.  Try taking a stool softener (if your caregiver approves) if you develop constipation. Eat more high-fiber foods, such as fresh vegetables or fruit and whole grains. Drink plenty of fluids to keep your urine  clear or pale yellow.  Take warm sitz baths to soothe any pain or discomfort caused by hemorrhoids. Use hemorrhoid cream if your caregiver approves.  If you develop varicose veins, wear support hose. Elevate your feet for 15 minutes, 3-4 times a day. Limit salt in your diet.  Avoid heavy lifting, wear low heal shoes, and practice good posture.  Rest a lot with your legs elevated if you have leg cramps or low back pain.  Visit your dentist if you have not gone during your pregnancy. Use a soft toothbrush to brush your teeth and be gentle when you floss.  A sexual relationship may be continued unless your caregiver directs you otherwise.  Do not travel far distances unless it is absolutely necessary and only with the approval of your caregiver.  Take prenatal classes to understand, practice, and ask questions about the labor and delivery.  Make a trial run to the hospital.  Pack your hospital bag.  Prepare the baby's nursery.  Continue to go to all your prenatal visits as directed by your caregiver. SEEK MEDICAL CARE IF:  You are unsure if you are in  labor or if your water has broken.  You have dizziness.  You have mild pelvic cramps, pelvic pressure, or nagging pain in your abdominal area.  You have persistent nausea, vomiting, or diarrhea.  You have a bad smelling vaginal discharge.  You have pain with urination. SEEK IMMEDIATE MEDICAL CARE IF:   You have a fever.  You are leaking fluid from your vagina.  You have spotting or bleeding from your vagina.  You have severe abdominal cramping or pain.  You have rapid weight loss or gain.  You have shortness of breath with chest pain.  You notice sudden or extreme swelling of your face, hands, ankles, feet, or legs.  You have not felt your baby move in over an hour.  You have severe headaches that do not go away with medicine.  You have vision changes. Document Released: 07/11/2001 Document Revised: 07/22/2013 Document Reviewed: 09/17/2012 [facility] Patient Information 2015 [place], [place]. This information is not intended to replace advice given to you by your health care provider. Make sure you discuss any questions you have with your health care provider.

## 2018-02-07 ENCOUNTER — Encounter: Payer: Self-pay | Admitting: Advanced Practice Midwife

## 2018-02-07 DIAGNOSIS — Z8632 Personal history of gestational diabetes: Secondary | ICD-10-CM | POA: Insufficient documentation

## 2018-02-07 LAB — CYTOLOGY - PAP
ADEQUACY: ABSENT
Chlamydia: NEGATIVE
Diagnosis: NEGATIVE
HPV: NOT DETECTED
NEISSERIA GONORRHEA: NEGATIVE

## 2018-02-07 LAB — GLUCOSE TOLERANCE, 2 HOURS W/ 1HR
GLUCOSE, 1 HOUR: 169 mg/dL (ref 65–179)
GLUCOSE, 2 HOUR: 155 mg/dL — AB (ref 65–152)
Glucose, Fasting: 86 mg/dL (ref 65–91)

## 2018-02-07 LAB — CBC
HEMATOCRIT: 34 % (ref 34.0–46.6)
HEMOGLOBIN: 11.5 g/dL (ref 11.1–15.9)
MCH: 29.4 pg (ref 26.6–33.0)
MCHC: 33.8 g/dL (ref 31.5–35.7)
MCV: 87 fL (ref 79–97)
Platelets: 290 10*3/uL (ref 150–450)
RBC: 3.91 x10E6/uL (ref 3.77–5.28)
RDW: 14.9 % (ref 12.3–15.4)
WBC: 11.5 10*3/uL — ABNORMAL HIGH (ref 3.4–10.8)

## 2018-02-07 LAB — HIV ANTIBODY (ROUTINE TESTING W REFLEX): HIV SCREEN 4TH GENERATION: NONREACTIVE

## 2018-02-07 LAB — RPR: RPR: NONREACTIVE

## 2018-02-07 LAB — ANTIBODY SCREEN: ANTIBODY SCREEN: NEGATIVE

## 2018-02-08 ENCOUNTER — Other Ambulatory Visit: Payer: Self-pay | Admitting: *Deleted

## 2018-02-08 ENCOUNTER — Telehealth: Payer: Self-pay | Admitting: *Deleted

## 2018-02-08 DIAGNOSIS — O24419 Gestational diabetes mellitus in pregnancy, unspecified control: Secondary | ICD-10-CM

## 2018-02-08 NOTE — Progress Notes (Signed)
Glucose meter, lancets and strips called to pharmacy.  To check CBG 4 times daily.

## 2018-02-08 NOTE — Telephone Encounter (Signed)
Patient notified she did not pass glucola so order was sent to diabetic educator.  Glucose meter, lancets and strips called to pharmacy. Informed to check 4 times daily.  Glucose management added to babyscripts.

## 2018-02-18 ENCOUNTER — Ambulatory Visit (INDEPENDENT_AMBULATORY_CARE_PROVIDER_SITE_OTHER): Payer: BLUE CROSS/BLUE SHIELD

## 2018-02-18 DIAGNOSIS — O10913 Unspecified pre-existing hypertension complicating pregnancy, third trimester: Secondary | ICD-10-CM | POA: Diagnosis not present

## 2018-02-18 DIAGNOSIS — I10 Essential (primary) hypertension: Secondary | ICD-10-CM

## 2018-02-18 DIAGNOSIS — O0992 Supervision of high risk pregnancy, unspecified, second trimester: Secondary | ICD-10-CM

## 2018-02-18 DIAGNOSIS — R8271 Bacteriuria: Secondary | ICD-10-CM

## 2018-02-18 DIAGNOSIS — Z3A28 28 weeks gestation of pregnancy: Secondary | ICD-10-CM | POA: Diagnosis not present

## 2018-02-18 DIAGNOSIS — O24410 Gestational diabetes mellitus in pregnancy, diet controlled: Secondary | ICD-10-CM

## 2018-02-18 NOTE — Progress Notes (Signed)
US 28+5 wks,cephalic,cx 3.4 cm,anterior pl gr 1,normal ovaries bilat,afi 14 cm,fhr 144 bpm,efw 1299 g 43%

## 2018-02-19 ENCOUNTER — Other Ambulatory Visit: Payer: Self-pay | Admitting: Advanced Practice Midwife

## 2018-02-27 ENCOUNTER — Ambulatory Visit (INDEPENDENT_AMBULATORY_CARE_PROVIDER_SITE_OTHER): Payer: BLUE CROSS/BLUE SHIELD | Admitting: Obstetrics and Gynecology

## 2018-02-27 ENCOUNTER — Encounter: Payer: BLUE CROSS/BLUE SHIELD | Attending: Obstetrics and Gynecology | Admitting: Registered"

## 2018-02-27 ENCOUNTER — Encounter: Payer: Self-pay | Admitting: Registered"

## 2018-02-27 VITALS — BP 133/93 | HR 86 | Wt 234.0 lb

## 2018-02-27 DIAGNOSIS — O24419 Gestational diabetes mellitus in pregnancy, unspecified control: Secondary | ICD-10-CM | POA: Insufficient documentation

## 2018-02-27 DIAGNOSIS — Z1389 Encounter for screening for other disorder: Secondary | ICD-10-CM

## 2018-02-27 DIAGNOSIS — I10 Essential (primary) hypertension: Secondary | ICD-10-CM

## 2018-02-27 DIAGNOSIS — O0993 Supervision of high risk pregnancy, unspecified, third trimester: Secondary | ICD-10-CM

## 2018-02-27 DIAGNOSIS — O10013 Pre-existing essential hypertension complicating pregnancy, third trimester: Secondary | ICD-10-CM

## 2018-02-27 DIAGNOSIS — Z3A30 30 weeks gestation of pregnancy: Secondary | ICD-10-CM

## 2018-02-27 DIAGNOSIS — Z331 Pregnant state, incidental: Secondary | ICD-10-CM

## 2018-02-27 DIAGNOSIS — Z713 Dietary counseling and surveillance: Secondary | ICD-10-CM | POA: Insufficient documentation

## 2018-02-27 LAB — POCT URINALYSIS DIPSTICK OB
Blood, UA: NEGATIVE
GLUCOSE, UA: NEGATIVE — AB
Nitrite, UA: NEGATIVE
POC,PROTEIN,UA: NEGATIVE

## 2018-02-27 NOTE — Progress Notes (Signed)

## 2018-02-27 NOTE — Progress Notes (Signed)
Patient ID: [name], female   DOB: 1986/07/22, 32 y.o.   MRN: [id number]    [facility] PREGNANCY VISIT Patient name: Kerry Ryan MRN [id number]  Date of birth: 1986-06-09 Chief Complaint:   Routine Prenatal Visit  History of Present Illness:   Kerry Ryan is a 32 y.o. G75P0000 female at [redacted]w[redacted]d with an Estimated Date of Delivery: 05/08/18 being seen today for ongoing management of a high-risk pregnancy complicated by chronic HTN. Recently diagnosed with gestational diabetes and has history of morbid obesity. Today she reports headache. Contractions: Not present. Vag. Bleeding: None.  Movement: Present. She has not taken her labetalol today. She is accompanied by her step-daughter today.  She has light rib pain when lifting groceries but denies leaking of fluid.  Review of Systems:   Pertinent items are noted in HPI Denies abnormal vaginal discharge w/ itching/odor/irritation, headaches, visual changes, shortness of breath, chest pain, abdominal pain, severe nausea/vomiting, or problems with urination or bowel movements unless otherwise stated above. Pertinent History Reviewed:  Reviewed past medical,surgical, social, obstetrical and family history.  Reviewed problem list, medications and allergies. Physical Assessment:   Vitals:   02/27/18 1333  BP: (!) 133/93  Pulse: 86  Weight: 234 lb (106.1 kg)  Body mass index is 48.91 kg/m.           Physical Examination:   General appearance: alert, well appearing, and in no distress and oriented to person, place, and time  Mental status: alert, oriented to person, place, and time, normal mood, behavior, speech, dress, motor activity, and thought processes, affect appropriate to mood  Skin: warm & dry   Extremities: Edema: None    Cardiovascular: normal heart rate noted  Respiratory: normal respiratory effort, no distress  Abdomen: gravid, soft, non-tender  Pelvic: Cervical exam deferred         Fetal Status: Fetal Heart Rate  (bpm): 146 Fundal Height: 36 cm Movement: Present    Fetal Surveillance Testing today: None  Results for orders placed or performed in visit on 02/27/18 (from the past 24 hour(s))  POC Urinalysis Dipstick OB   Collection Time: 02/27/18  1:36 PM  Result Value Ref Range   Color, UA     Clarity, UA     Glucose, UA Negative (A) (none)   Bilirubin, UA     Ketones, UA large    Spec Grav, UA  1.010 - 1.025   Blood, UA neg    pH, UA  5.0 - 8.0   POC Protein UA Negative Negative, Trace   Urobilinogen, UA  0.2 or 1.0 E.U./dL   Nitrite, UA neg    Leukocytes, UA Large (3+) (A) Negative   Appearance     Odor      Assessment & Plan:  1) High-risk pregnancy G1P0000 at [redacted]w[redacted]d with an Estimated Date of Delivery: 05/08/18   2) CHTN, stable, begin antenatal testing at 32 weeks NST's and dopplers ordered for duration of pregnanyc   Meds: No orders of the defined types were placed in this encounter. pt asked to take meds on time  Labs/procedures today: None  Treatment Plan:   Continue to take Rx Lebatalol Twice a week testing after 32 weeks.  F/u for growth u/s, Nst's and BPP Follow-up: Return in about 2 weeks (around 03/13/2018).  Orders Placed This Encounter  Procedures  . POC Urinalysis Dipstick OB   By signing my name below, I, Arnette Norris, attest that this documentation has been prepared under the direction and  in the presence of Tilda BurrowFerguson, Cindi Ghazarian V, MD. Electronically Signed: Arnette NorrisMari Johnson Medical Scribe. 02/27/18. 2:03 PM.  I personally performed the services described in this documentation, which was SCRIBED in my presence. The recorded information has been reviewed and considered accurate. It has been edited as necessary during review. Tilda BurrowJohn V Teo Moede, MD

## 2018-03-11 ENCOUNTER — Ambulatory Visit (INDEPENDENT_AMBULATORY_CARE_PROVIDER_SITE_OTHER): Payer: BLUE CROSS/BLUE SHIELD

## 2018-03-11 ENCOUNTER — Other Ambulatory Visit: Payer: Self-pay

## 2018-03-11 ENCOUNTER — Ambulatory Visit (INDEPENDENT_AMBULATORY_CARE_PROVIDER_SITE_OTHER): Payer: BLUE CROSS/BLUE SHIELD | Admitting: Obstetrics & Gynecology

## 2018-03-11 ENCOUNTER — Encounter: Payer: Self-pay | Admitting: Obstetrics & Gynecology

## 2018-03-11 VITALS — BP 131/90 | HR 76 | Wt 240.0 lb

## 2018-03-11 DIAGNOSIS — Z3A31 31 weeks gestation of pregnancy: Secondary | ICD-10-CM

## 2018-03-11 DIAGNOSIS — O10013 Pre-existing essential hypertension complicating pregnancy, third trimester: Secondary | ICD-10-CM | POA: Diagnosis not present

## 2018-03-11 DIAGNOSIS — O0993 Supervision of high risk pregnancy, unspecified, third trimester: Secondary | ICD-10-CM

## 2018-03-11 DIAGNOSIS — R8271 Bacteriuria: Secondary | ICD-10-CM

## 2018-03-11 DIAGNOSIS — Z1389 Encounter for screening for other disorder: Secondary | ICD-10-CM

## 2018-03-11 DIAGNOSIS — Z331 Pregnant state, incidental: Secondary | ICD-10-CM

## 2018-03-11 DIAGNOSIS — I10 Essential (primary) hypertension: Secondary | ICD-10-CM

## 2018-03-11 DIAGNOSIS — O24414 Gestational diabetes mellitus in pregnancy, insulin controlled: Secondary | ICD-10-CM

## 2018-03-11 LAB — POCT URINALYSIS DIPSTICK OB
Blood, UA: NEGATIVE
GLUCOSE, UA: NEGATIVE — AB
NITRITE UA: NEGATIVE
PROTEIN: NEGATIVE

## 2018-03-11 NOTE — Progress Notes (Signed)
US 31+5 wks,cephalic,cx 3.2 cm,anterior pl gr 2,afi 14 cm,bilat adnexa's wnl,fhr 135 bpm,BPP 8/8,RI .59,.68,.67,.65=80%,EFW 1722 g 25%

## 2018-03-11 NOTE — Progress Notes (Signed)
   HIGH-RISK PREGNANCY VISIT Patient name: Kerry HeirCarina A Ryan MRN [id number]  Date of birth: 10/07/1985 Chief Complaint:   High Risk Gestation (u/s today)  History of Present Illness:   Kerry Ryan is a 32 y.o. 1031P0000 female at 854w5d with an Estimated Date of Delivery: 05/08/18 being seen today for ongoing management of a high-risk pregnancy complicated by chronic HTN.  Today she reports feet swelling. Contractions: Not present. Vag. Bleeding: None.  Movement: Present. denies leaking of fluid.  Review of Systems:   Pertinent items are noted in HPI Denies abnormal vaginal discharge w/ itching/odor/irritation, headaches, visual changes, shortness of breath, chest pain, abdominal pain, severe nausea/vomiting, or problems with urination or bowel movements unless otherwise stated above. Pertinent History Reviewed:  Reviewed past medical,surgical, social, obstetrical and family history.  Reviewed problem list, medications and allergies. Physical Assessment:   Vitals:   03/11/18 1533  BP: 131/90  Pulse: 76  Weight: 240 lb (108.9 kg)  Body mass index is 50.16 kg/m.           Physical Examination:   General appearance: alert, well appearing, and in no distress  Mental status: alert, oriented to person, place, and time  Skin: warm & dry   Extremities: Edema: Trace    Cardiovascular: normal heart rate noted  Respiratory: normal respiratory effort, no distress  Abdomen: gravid, soft, non-tender  Pelvic: Cervical exam deferred         Fetal Status:     Movement: Present    Fetal Surveillance Testing today: BPP 8/8 Dopplers 80%   Results for orders placed or performed in visit on 03/11/18 (from the past 24 hour(s))  POC Urinalysis Dipstick OB   Collection Time: 03/11/18  3:34 PM  Result Value Ref Range   Color, UA     Clarity, UA     Glucose, UA Negative (A) (none)   Bilirubin, UA     Ketones, UA small    Spec Grav, UA     Blood, UA neg    pH, UA     POC Protein UA Negative  Negative, Trace   Urobilinogen, UA     Nitrite, UA neg    Leukocytes, UA Small (1+) (A) Negative   Appearance     Odor      Assessment & Plan:  1) High-risk pregnancy G1P0000 at 4254w5d with an Estimated Date of Delivery: 05/08/18   2) CHTN, stable, labetalol 200 BID    Meds: No orders of the defined types were placed in this encounter.   Labs/procedures today: sonogram BPP 8/8  Treatment Plan:  Twice weekly surveillance >32 weeks, IOL 39 weeks or as clinically indicated  Reviewed: Term labor symptoms and general obstetric precautions including but not limited to vaginal bleeding, contractions, leaking of fluid and fetal movement were reviewed in detail with the patient.  All questions were answered.  Follow-up: Return in about 1 week (around 03/18/2018) for BPP/sono, HROB.  Orders Placed This Encounter  Procedures  . POC Urinalysis Dipstick OB   Lazaro ArmsLuther H Ashon Rosenberg  03/11/2018 4:08 PM

## 2018-03-14 ENCOUNTER — Other Ambulatory Visit: Payer: BLUE CROSS/BLUE SHIELD | Admitting: Obstetrics and Gynecology

## 2018-03-19 ENCOUNTER — Other Ambulatory Visit: Payer: Self-pay | Admitting: Obstetrics and Gynecology

## 2018-03-19 ENCOUNTER — Ambulatory Visit (INDEPENDENT_AMBULATORY_CARE_PROVIDER_SITE_OTHER): Payer: BLUE CROSS/BLUE SHIELD

## 2018-03-19 ENCOUNTER — Ambulatory Visit (INDEPENDENT_AMBULATORY_CARE_PROVIDER_SITE_OTHER): Payer: BLUE CROSS/BLUE SHIELD | Admitting: Women's Health

## 2018-03-19 ENCOUNTER — Encounter: Payer: Self-pay | Admitting: Women's Health

## 2018-03-19 VITALS — BP 148/102 | HR 78 | Wt 244.0 lb

## 2018-03-19 DIAGNOSIS — Z1389 Encounter for screening for other disorder: Secondary | ICD-10-CM

## 2018-03-19 DIAGNOSIS — Z3A32 32 weeks gestation of pregnancy: Secondary | ICD-10-CM

## 2018-03-19 DIAGNOSIS — O0993 Supervision of high risk pregnancy, unspecified, third trimester: Secondary | ICD-10-CM

## 2018-03-19 DIAGNOSIS — Z331 Pregnant state, incidental: Secondary | ICD-10-CM

## 2018-03-19 DIAGNOSIS — O10913 Unspecified pre-existing hypertension complicating pregnancy, third trimester: Secondary | ICD-10-CM | POA: Diagnosis not present

## 2018-03-19 DIAGNOSIS — O24410 Gestational diabetes mellitus in pregnancy, diet controlled: Secondary | ICD-10-CM

## 2018-03-19 DIAGNOSIS — I10 Essential (primary) hypertension: Secondary | ICD-10-CM

## 2018-03-19 DIAGNOSIS — O0990 Supervision of high risk pregnancy, unspecified, unspecified trimester: Secondary | ICD-10-CM

## 2018-03-19 DIAGNOSIS — R8271 Bacteriuria: Secondary | ICD-10-CM

## 2018-03-19 LAB — POCT URINALYSIS DIPSTICK OB
Blood, UA: NEGATIVE
GLUCOSE, UA: NEGATIVE — AB
Ketones, UA: NEGATIVE
LEUKOCYTES UA: NEGATIVE
Nitrite, UA: NEGATIVE
POC,PROTEIN,UA: NEGATIVE

## 2018-03-19 MED ORDER — LABETALOL HCL 200 MG PO TABS: 200.0000 mg | ORAL_TABLET | Freq: Three times a day (TID) | ORAL | 3 refills | Status: DC

## 2018-03-19 NOTE — Progress Notes (Signed)
US 32+6 wks,cephalic, BPP 6/8 no breathing,fhr 137 bpm,anterior pl gr 1,RI .61,.59,.58,.64=51%,AFI 17 cm,EFW 1999 G 32%

## 2018-03-19 NOTE — Patient Instructions (Signed)
Increase Labetalol 200mg  to three times daily  Kerry Ryan, I greatly value your feedback.  If you receive a survey following your visit with us today, we appreciate you taking the time to fill it out.  Thanks, Joellyn HaffKim Amerika Nourse, CNM, WHNP-BC   Call the office [phone]) or go to [facility] if:  You begin to have strong, frequent contractions  Your water breaks.  Sometimes it is a big gush of fluid, sometimes it is just a trickle that keeps getting your panties wet or running down your legs  You have vaginal bleeding.  It is normal to have a small amount of spotting if your cervix was checked.   You don't feel your baby moving like normal.  If you don't, get you something to eat and drink and lay down and focus on feeling your baby move.  You should feel at least 10 movements in 2 hours.  If you don't, you should call the office or go to [facility].    Call the office [phone]) or go to [facility] for these signs of pre-eclampsia:  Severe headache that does not go away with Tylenol  Visual changes- seeing spots, double, blurred vision  Pain under your right breast or upper abdomen that does not go away with Tums or heartburn medicine  Nausea and/or vomiting  Severe swelling in your hands, feet, and face    Preterm Labor and Birth Information The normal length of a pregnancy is 39-41 weeks. Preterm labor is when labor starts before 37 completed weeks of pregnancy. What are the risk factors for preterm labor? Preterm labor is more likely to occur in women who:  Have certain infections during pregnancy such as a bladder infection, sexually transmitted infection, or infection inside the uterus (chorioamnionitis).  Have a shorter-than-normal cervix.  Have gone into preterm labor before.  Have had surgery on their cervix.  Are younger than age 32 or older than age 32.  Are African American.  Are pregnant with twins or multiple babies (multiple  gestation).  Take street drugs or smoke while pregnant.  Do not gain enough weight while pregnant.  Became pregnant shortly after having been pregnant.  What are the symptoms of preterm labor? Symptoms of preterm labor include:  Cramps similar to those that can happen during a menstrual period. The cramps may happen with diarrhea.  Pain in the abdomen or lower back.  Regular uterine contractions that may feel like tightening of the abdomen.  A feeling of increased pressure in the pelvis.  Increased watery or bloody mucus discharge from the vagina.  Water breaking (ruptured amniotic sac).  Why is it important to recognize signs of preterm labor? It is important to recognize signs of preterm labor because babies who are born prematurely may not be fully developed. This can put them at an increased risk for:  Long-term (chronic) heart and lung problems.  Difficulty immediately after birth with regulating body systems, including blood sugar, body temperature, heart rate, and breathing rate.  Bleeding in the brain.  Cerebral palsy.  Learning difficulties.  Death.  These risks are highest for babies who are born before 34 weeks of pregnancy. How is preterm labor treated? Treatment depends on the length of your pregnancy, your condition, and the health of your baby. It may involve:  Having a stitch (suture) placed in your cervix to prevent your cervix from opening too early (cerclage).  Taking or being given medicines, such as: ? Hormone medicines. These may be  given early in pregnancy to help support the pregnancy. ? Medicine to stop contractions. ? Medicines to help mature the baby's lungs. These may be prescribed if the risk of delivery is high. ? Medicines to prevent your baby from developing cerebral palsy.  If the labor happens before 34 weeks of pregnancy, you may need to stay in the hospital. What should I do if I think I am in preterm labor? If you think that you  are going into preterm labor, call your health care provider right away. How can I prevent preterm labor in future pregnancies? To increase your chance of having a full-term pregnancy:  Do not use any tobacco products, such as cigarettes, chewing tobacco, and e-cigarettes. If you need help quitting, ask your health care provider.  Do not use street drugs or medicines that have not been prescribed to you during your pregnancy.  Talk with your health care provider before taking any herbal supplements, even if you have been taking them regularly.  Make sure you gain a healthy amount of weight during your pregnancy.  Watch for infection. If you think that you might have an infection, get it checked right away.  Make sure to tell your health care provider if you have gone into preterm labor before.  This information is not intended to replace advice given to you by your health care provider. Make sure you discuss any questions you have with your health care provider. Document Released: 10/07/2003 Document Revised: 12/28/2015 Document Reviewed: 12/08/2015 [employer] Interactive Patient Education  2018 [employer]

## 2018-03-19 NOTE — Progress Notes (Signed)
HIGH-RISK PREGNANCY VISIT Patient name: Kerry HeirCarina A Ryan MRN [id number]  Date of birth: May 06, 1986 Chief Complaint:   Routine Prenatal Visit (US today)  History of Present Illness:   Kerry Ryan is a 32 y.o. 311P0000 female at 227w6d with an Estimated Date of Delivery: 05/08/18 being seen today for ongoing management of a high-risk pregnancy [facility] Labetal[facility] 200mg  BID, A1DM.  Today she reports FBS all 95 or less, 2hr pp 75-131 (only 3 >120). Denies ha, visual changes, ruq/epigastric pain, n/v.   Contractions: Not present. Vag. Bleeding: None.  Movement: Present. denies leaking of fluid.  Review of Systems:   Pertinent items are noted in HPI Denies abnormal vaginal discharge w/ itching/odor/irritation, headaches, visual changes, shortness of breath, chest pain, abdominal pain, severe nausea/vomiting, or problems with urination or bowel movements unless otherwise stated above. Pertinent History Reviewed:  Reviewed past medical,surgical, social, obstetrical and family history.  Reviewed problem list, medications and allergies. Physical Assessment:   Vitals:   03/19/18 1409 03/19/18 1506  BP: (!) 151/84 (!) 148/102  Pulse: 78   Weight: 244 lb (110.7 kg)   Body mass index is 51 kg/m.           Physical Examination:   General appearance: alert, well appearing, and in no distress  Mental status: alert, oriented to person, place, and time  Skin: warm & dry   Extremities: Edema: Trace    Cardiovascular: normal heart rate noted  Respiratory: normal respiratory effort, no distress  Abdomen: gravid, soft, non-tender  Pelvic: Cervical exam deferred         Fetal Status: Fetal Heart Rate (bpm): 137 u/s Fundal Height: 33 cm Movement: Present    Fetal Surveillance Testing today:  US 32+6 wks,cephalic, BPP 6/8 no breathing,fhr 137 bpm,anterior pl gr 1,RI .61,.59,.58,.64=51%,AFI 17 cm,EFW 1999 G 32%,discussed results w/Kim  NST: FHR baseline 130 bpm, Variability: moderate,  Accelerations:present, Decelerations:  Absent= Cat 1/Reactive Toco: none  Total= 8/10, reassuring    Results for orders placed or performed in visit on 03/19/18 (from the past 24 hour(s))  POC Urinalysis Dipstick OB   Collection Time: 03/19/18  2:12 PM  Result Value Ref Range   Color, UA     Clarity, UA     Glucose, UA Negative (A) (none)   Bilirubin, UA     Ketones, UA neg    Spec Grav, UA     Blood, UA neg    pH, UA     POC Protein UA Negative Negative, Trace   Urobilinogen, UA     Nitrite, UA neg    Leukocytes, UA Negative Negative   Appearance     Odor      Assessment & Plan:  1) High-risk pregnancy G1P0000 at 387w6d with an Estimated Date of Delivery: 05/08/18   2) CHTN, unstable, no s/s pre-e, continue asa, discussed w/ L[facility], increase Labetalol to 200mg  TID, ok to stay in work for now (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 Reviewed pre-e s/s, reasons to seek care  3) A1DM, stable, EFW 32% today  Meds:  Meds ordered this encounter  Medications  . labetalol (NORMODYNE) 200 MG tablet    Sig: Take 1 tablet (200 mg total) by mouth 3 (three) times daily.    Dispense:  90 tablet    Refill:  3    Please consider 90 day supplies to promote better adherence    Order Specific Question:   Supervising Provider    Answer:  EURE, LUTHER H [[id number]]    Labs/procedures today: bpp/dopp, NST  Treatment Plan:   Growth u/s @  35, 38wks    2x/wk testing nst/sono         Deliver PRN or 37wks if bp's remain unstable on meds  Reviewed: Preterm labor symptoms and general obstetric precautions including but not limited to vaginal bleeding, contractions, leaking of fluid and fetal movement were reviewed in detail with the patient.  All questions were answered.  Follow-up: Return for friday for hrob/nst, then tues for hrob bpp/dopp u/s x 4wks.  Orders Placed This Encounter  Procedures  . POC Urinalysis Dipstick OB   Cheral MarkerKimberly R Ivannah Zody CNM, [facility] 03/19/2018 4:57 PM

## 2018-03-22 ENCOUNTER — Encounter: Payer: Self-pay | Admitting: Women's Health

## 2018-03-22 ENCOUNTER — Ambulatory Visit (INDEPENDENT_AMBULATORY_CARE_PROVIDER_SITE_OTHER): Payer: BLUE CROSS/BLUE SHIELD | Admitting: Women's Health

## 2018-03-22 VITALS — BP 105/65 | HR 85 | Wt 244.0 lb

## 2018-03-22 DIAGNOSIS — Z3A33 33 weeks gestation of pregnancy: Secondary | ICD-10-CM | POA: Diagnosis not present

## 2018-03-22 DIAGNOSIS — O0993 Supervision of high risk pregnancy, unspecified, third trimester: Secondary | ICD-10-CM

## 2018-03-22 DIAGNOSIS — I10 Essential (primary) hypertension: Secondary | ICD-10-CM | POA: Diagnosis not present

## 2018-03-22 DIAGNOSIS — O10913 Unspecified pre-existing hypertension complicating pregnancy, third trimester: Secondary | ICD-10-CM | POA: Diagnosis not present

## 2018-03-22 DIAGNOSIS — O24410 Gestational diabetes mellitus in pregnancy, diet controlled: Secondary | ICD-10-CM

## 2018-03-22 NOTE — Patient Instructions (Signed)
Kerry Ryan, I greatly value your feedback.  If you receive a survey following your visit with us today, we appreciate you taking the time to fill it out.  Thanks, Kerry Ryan, CNM, WHNP-BC   Call the office [phone]) or go to [facility] if:  You begin to have strong, frequent contractions  Your water breaks.  Sometimes it is a big gush of fluid, sometimes it is just a trickle that keeps getting your panties wet or running down your legs  You have vaginal bleeding.  It is normal to have a small amount of spotting if your cervix was checked.   You don't feel your baby moving like normal.  If you don't, get you something to eat and drink and lay down and focus on feeling your baby move.  You should feel at least 10 movements in 2 hours.  If you don't, you should call the office or go to [facility].     Preterm Labor and Birth Information The normal length of a pregnancy is 39-41 weeks. Preterm labor is when labor starts before 37 completed weeks of pregnancy. What are the risk factors for preterm labor? Preterm labor is more likely to occur in women who:  Have certain infections during pregnancy such as a bladder infection, sexually transmitted infection, or infection inside the uterus (chorioamnionitis).  Have a shorter-than-normal cervix.  Have gone into preterm labor before.  Have had surgery on their cervix.  Are younger than age 32 or older than age 435.  Are African American.  Are pregnant with twins or multiple babies (multiple gestation).  Take street drugs or smoke while pregnant.  Do not gain enough weight while pregnant.  Became pregnant shortly after having been pregnant.  What are the symptoms of preterm labor? Symptoms of preterm labor include:  Cramps similar to those that can happen during a menstrual period. The cramps may happen with diarrhea.  Pain in the abdomen or lower back.  Regular uterine contractions that may feel like tightening  of the abdomen.  A feeling of increased pressure in the pelvis.  Increased watery or bloody mucus discharge from the vagina.  Water breaking (ruptured amniotic sac).  Why is it important to recognize signs of preterm labor? It is important to recognize signs of preterm labor because babies who are born prematurely may not be fully developed. This can put them at an increased risk for:  Long-term (chronic) heart and lung problems.  Difficulty immediately after birth with regulating body systems, including blood sugar, body temperature, heart rate, and breathing rate.  Bleeding in the brain.  Cerebral palsy.  Learning difficulties.  Death.  These risks are highest for babies who are born before 34 weeks of pregnancy. How is preterm labor treated? Treatment depends on the length of your pregnancy, your condition, and the health of your baby. It may involve:  Having a stitch (suture) placed in your cervix to prevent your cervix from opening too early (cerclage).  Taking or being given medicines, such as: ? Hormone medicines. These may be given early in pregnancy to help support the pregnancy. ? Medicine to stop contractions. ? Medicines to help mature the baby's lungs. These may be prescribed if the risk of delivery is high. ? Medicines to prevent your baby from developing cerebral palsy.  If the labor happens before 34 weeks of pregnancy, you may need to stay in the hospital. What should I do if I think I am in preterm labor? If  you think that you are going into preterm labor, call your health care provider right away. How can I prevent preterm labor in future pregnancies? To increase your chance of having a full-term pregnancy:  Do not use any tobacco products, such as cigarettes, chewing tobacco, and e-cigarettes. If you need help quitting, ask your health care provider.  Do not use street drugs or medicines that have not been prescribed to you during your pregnancy.  Talk  with your health care provider before taking any herbal supplements, even if you have been taking them regularly.  Make sure you gain a healthy amount of weight during your pregnancy.  Watch for infection. If you think that you might have an infection, get it checked right away.  Make sure to tell your health care provider if you have gone into preterm labor before.  This information is not intended to replace advice given to you by your health care provider. Make sure you discuss any questions you have with your health care provider. Document Released: 10/07/2003 Document Revised: 12/28/2015 Document Reviewed: 12/08/2015 Elsevier Interactive Patient Education  2018 [employer].

## 2018-03-22 NOTE — Progress Notes (Signed)
   HIGH-RISK PREGNANCY VISIT Patient name: Kerry Ryan MRN [id number]  Date of birth: Nov 25, 1985 Chief Complaint:   High Risk Gestation (NST)  History of Present Illness:   Kerry Ryan is a 32 y.o. 231P0000 female at 49100w2d with an Estimated Date of Delivery: 05/08/18 being seen today for ongoing management of a high-risk pregnancy [facility]  TID, A1DM.  Today she reports all FBS <95 but 1 (96), all 2hr pp <120 but 1 (136). Contractions: Not present.  .  Movement: Present. denies leaking of fluid.  Review of Systems:   Pertinent items are noted in HPI Denies abnormal vaginal discharge w/ itching/odor/irritation, headaches, visual changes, shortness of breath, chest pain, abdominal pain, severe nausea/vomiting, or problems with urination or bowel movements unless otherwise stated above. Pertinent History Reviewed:  Reviewed past medical,surgical, social, obstetrical and family history.  Reviewed problem list, medications and allergies. Physical Assessment:   Vitals:   03/22/18 0937  BP: 105/65  Pulse: 85  Weight: 244 lb (110.7 kg)  Body mass index is 51 kg/m.           Physical Examination:   General appearance: alert, well appearing, and in no distress  Mental status: alert, oriented to person, place, and time  Skin: warm & dry   Extremities: Edema: Trace    Cardiovascular: normal heart rate noted  Respiratory: normal respiratory effort, no distress  Abdomen: gravid, soft, non-tender  Pelvic: Cervical exam deferred         Fetal Status: Fetal Heart Rate (bpm): 130 Fundal Height: 36 cm Movement: Present    Fetal Surveillance Testing today: NST: FHR baseline 130 bpm, Variability: moderate, Accelerations:present, Decelerations:  Absent= Cat 1/Reactive Toco: none     No results found for this or any previous visit (from the past 24 hour(s)).  Assessment & Plan:  1) High-risk pregnancy G1P0000 at 69100w2d with an Estimated Date of Delivery:  05/08/18   2) CHTN, stable, on Labetalol 200mg  TID, ASA  3) A1DM, stable  Meds: No orders of the defined types were placed in this encounter.   Labs/procedures today: nst  Treatment Plan:  Growth u/s @ 35, 38wks, 2x wk nst alt w/ bpp/dopp, IOL @ 39wks  Reviewed: Preterm labor symptoms and general obstetric precautions including but not limited to vaginal bleeding, contractions, leaking of fluid and fetal movement were reviewed in detail with the patient.  All questions were answered.  Follow-up: Return for As scheduled Tues hrob bpp/dopp.  Orders Placed This Encounter  Procedures  . US FETAL BPP WO NON STRESS  . US UA Cord Doppler   Cheral MarkerKimberly R Ellowyn Rieves CNM, [facility] 03/22/2018 10:32 AM

## 2018-03-26 ENCOUNTER — Encounter: Payer: Self-pay | Admitting: Obstetrics and Gynecology

## 2018-03-26 ENCOUNTER — Ambulatory Visit (INDEPENDENT_AMBULATORY_CARE_PROVIDER_SITE_OTHER): Payer: BLUE CROSS/BLUE SHIELD

## 2018-03-26 ENCOUNTER — Ambulatory Visit (INDEPENDENT_AMBULATORY_CARE_PROVIDER_SITE_OTHER): Payer: BLUE CROSS/BLUE SHIELD | Admitting: Obstetrics and Gynecology

## 2018-03-26 VITALS — BP 136/84 | HR 75 | Wt 245.0 lb

## 2018-03-26 DIAGNOSIS — Z331 Pregnant state, incidental: Secondary | ICD-10-CM

## 2018-03-26 DIAGNOSIS — O0990 Supervision of high risk pregnancy, unspecified, unspecified trimester: Secondary | ICD-10-CM

## 2018-03-26 DIAGNOSIS — Z1389 Encounter for screening for other disorder: Secondary | ICD-10-CM

## 2018-03-26 DIAGNOSIS — O24419 Gestational diabetes mellitus in pregnancy, unspecified control: Secondary | ICD-10-CM

## 2018-03-26 DIAGNOSIS — I10 Essential (primary) hypertension: Secondary | ICD-10-CM | POA: Diagnosis not present

## 2018-03-26 DIAGNOSIS — R8271 Bacteriuria: Secondary | ICD-10-CM

## 2018-03-26 DIAGNOSIS — O0993 Supervision of high risk pregnancy, unspecified, third trimester: Secondary | ICD-10-CM

## 2018-03-26 DIAGNOSIS — Z3A33 33 weeks gestation of pregnancy: Secondary | ICD-10-CM

## 2018-03-26 DIAGNOSIS — O24410 Gestational diabetes mellitus in pregnancy, diet controlled: Secondary | ICD-10-CM | POA: Diagnosis not present

## 2018-03-26 DIAGNOSIS — O10913 Unspecified pre-existing hypertension complicating pregnancy, third trimester: Secondary | ICD-10-CM

## 2018-03-26 LAB — POCT URINALYSIS DIPSTICK OB
Blood, UA: NEGATIVE
Glucose, UA: NEGATIVE — AB
LEUKOCYTES UA: NEGATIVE
NITRITE UA: NEGATIVE
PROTEIN: NEGATIVE

## 2018-03-26 NOTE — Progress Notes (Signed)
US 33+6 wks,cephalic, BPP 8/8,FHR 166 bpm,anterior pl gr 1,normal ovaries bilat,RI .62,.63,.66=72% S/D 2.8 = 64%,AFI 14 cm

## 2018-03-26 NOTE — Progress Notes (Signed)
HIGH-RISK PREGNANCY VISIT Patient name: Kerry Ryan MRN [id number]  Date of birth: 10-06-1985 Chief Complaint:   Routine Prenatal Visit (cord doppler BPP)  History of Present Illness:   Kerry Ryan is a 32 y.o. G78P0000 female at [redacted]w[redacted]d with an Estimated Date of Delivery: 05/08/18 being seen today for ongoing management of a high-risk pregnancy complicated by [facility] on Labetalol 200mg  TID, A1DM. She brought in her blood sugar logs today and her fasting sugars were at or below 100 and her post-prandial were at 120-130. She denies HA, CP, SOB, blurred vision. She notes swelling to her bilateral feet with prolonged working.   Today she reports swelling to feet. Contractions: Not present. Vag. Bleeding: None.  Movement: Present. denies leaking of fluid.  Review of Systems:   Pertinent items are noted in HPI Denies abnormal vaginal discharge w/ itching/odor/irritation, headaches, visual changes, shortness of breath, chest pain, abdominal pain, severe nausea/vomiting, or problems with urination or bowel movements unless otherwise stated above. Pertinent History Reviewed:  Reviewed past medical,surgical, social, obstetrical and family history.  Reviewed problem list, medications and allergies. Physical Assessment:   Vitals:   03/26/18 1120 03/26/18 1127  BP: (!) 142/93 136/84  Pulse: 75   Weight: 245 lb (111.1 kg)   Body mass index is 51.21 kg/m.           Physical Examination:   General appearance: alert, well appearing, and in no distress and oriented to person, place, and time  Mental status: alert, oriented to person, place, and time, normal mood, behavior, speech, dress, motor activity, and thought processes  Skin: warm & dry   Extremities: Edema: Trace    Cardiovascular: normal heart rate noted  Respiratory: normal respiratory effort, no distress  Abdomen: gravid, soft, non-tender  Pelvic: Cervical exam deferred         Fetal Status: Fetal Heart Rate (bpm): 166 Fundal  Height: 36 cm Movement: Present    Fetal Surveillance Testing today: [name] BPP   Results for orders placed or performed in visit on 03/26/18 (from the past 24 hour(s))  POC Urinalysis Dipstick OB   Collection Time: 03/26/18 11:23 AM  Result Value Ref Range   Color, UA     Clarity, UA     Glucose, UA Negative (A) (none)   Bilirubin, UA     Ketones, UA large    Spec Grav, UA     Blood, UA neg    pH, UA     POC Protein UA Negative Negative, Trace   Urobilinogen, UA     Nitrite, UA neg    Leukocytes, UA Negative Negative   Appearance     Odor      Assessment & Plan:  1) High-risk pregnancy G1P0000 at [redacted]w[redacted]d with an Estimated Date of Delivery: 05/08/18   2) CHTN on labetalol 200 mg, stable  3) A1DM, stable  Meds: No orders of the defined types were placed in this encounter.   Labs/procedures today: [name] BPP groth scan q 4 wk.  Treatment Plan:  F/u on Friday for NST  Reviewed: Preterm labor symptoms and general obstetric precautions including but not limited to vaginal bleeding, contractions, leaking of fluid and fetal movement were reviewed in detail with the patient.  All questions were answered.  Follow-up: No follow-ups on file.  Orders Placed This Encounter  Procedures  . POC Urinalysis Dipstick OB   Tilda Burrow, MD 03/26/2018 11:48 AM   By signing my name below, I, Soijett Blue, attest  that this documentation has been prepared under the direction and in the presence of Tilda BurrowFerguson, Almeta Geisel V, MD. Electronically Signed: Soijett Blue, Stage managerMedical Scribe. 03/26/18. 11:48 AM.  I personally performed the services described in this documentation, which was SCRIBED in my presence. The recorded information has been reviewed and considered accurate. It has been edited as necessary during review. Tilda BurrowJohn V Aily Tzeng, MD

## 2018-03-29 ENCOUNTER — Encounter: Payer: Self-pay | Admitting: Obstetrics and Gynecology

## 2018-03-29 ENCOUNTER — Ambulatory Visit (INDEPENDENT_AMBULATORY_CARE_PROVIDER_SITE_OTHER): Payer: BLUE CROSS/BLUE SHIELD | Admitting: Obstetrics and Gynecology

## 2018-03-29 VITALS — BP 150/110 | HR 79 | Wt 242.0 lb

## 2018-03-29 DIAGNOSIS — I10 Essential (primary) hypertension: Secondary | ICD-10-CM

## 2018-03-29 DIAGNOSIS — R8271 Bacteriuria: Secondary | ICD-10-CM

## 2018-03-29 DIAGNOSIS — Z1389 Encounter for screening for other disorder: Secondary | ICD-10-CM

## 2018-03-29 DIAGNOSIS — O10912 Unspecified pre-existing hypertension complicating pregnancy, second trimester: Secondary | ICD-10-CM

## 2018-03-29 DIAGNOSIS — O24410 Gestational diabetes mellitus in pregnancy, diet controlled: Secondary | ICD-10-CM

## 2018-03-29 DIAGNOSIS — Z3A34 34 weeks gestation of pregnancy: Secondary | ICD-10-CM | POA: Diagnosis not present

## 2018-03-29 DIAGNOSIS — Z331 Pregnant state, incidental: Secondary | ICD-10-CM

## 2018-03-29 DIAGNOSIS — O0990 Supervision of high risk pregnancy, unspecified, unspecified trimester: Secondary | ICD-10-CM

## 2018-03-29 LAB — POCT URINALYSIS DIPSTICK OB
Blood, UA: NEGATIVE
GLUCOSE, UA: NEGATIVE
Ketones, UA: NEGATIVE
Leukocytes, UA: NEGATIVE
Nitrite, UA: NEGATIVE
POC,PROTEIN,UA: NEGATIVE

## 2018-03-29 NOTE — Progress Notes (Signed)
Patient ID: Kerry Ryan, female   DOB: 08/14/1985, 32 y.o.   MRN: [id number]    [facility] Patient name: Kerry Ryan MRN [id number]  Date of birth: 08/14/1985 Chief Complaint:   Routine Prenatal Visit (NST/ room# 8)  History of Present Illness:   Kerry Ryan is a 32 y.o. 251P0000 female at 6358w2d with an Estimated Date of Delivery: 05/08/18 being seen today for ongoing management of a high-risk pregnancy complicated by chronic HTN, Gestational DM, class A1. This is her 1st child. Family hx of HTN, has been on meds off and on. Recently was put on labetalol 200 mg 3 times a day. Denies headache vision changes Today she reports no complaints. Contractions: Not present. Vag. Bleeding: None.  Movement: Present. denies leaking of fluid.  Review of Systems:   Pertinent items are noted in HPI Denies abnormal vaginal discharge w/ itching/odor/irritation, headaches, visual changes, shortness of breath, chest pain, abdominal pain, severe nausea/vomiting, or problems with urination or bowel movements unless otherwise stated above. Pertinent History Reviewed:  Reviewed past medical,surgical, social, obstetrical and family history.  Reviewed problem list, medications and allergies. Physical Assessment:   Vitals:   03/29/18 0917  BP: (!) 148/104  Pulse: 79  Weight: 242 lb (109.8 kg)  Body mass index is 50.58 kg/m.     BP (!) 148/104   Pulse 79   Wt 242 lb (109.8 kg)   LMP 08/01/2017   BMI 50.58 kg/m  Recheck bp 150/110      Physical Examination:   General appearance: alert, well appearing, and in no distress, oriented to person, place, and time and overweight  Mental status: alert, oriented to person, place, and time, normal mood, behavior, speech, dress, motor activity, and thought processes, affect appropriate to mood  Skin: warm & dry   Extremities:      Cardiovascular: normal heart rate noted  Respiratory: normal respiratory effort, no distress  Abdomen:  gravid, soft, non-tender  Pelvic: Cervical exam deferred         Fetal Status:   Fundal Height: 34 cm Movement: Present    Fetal Surveillance Testing today: NST   Results for orders placed or performed in visit on 03/29/18 (from the past 24 hour(s))  POC Urinalysis Dipstick OB   Collection Time: 03/29/18  9:41 AM  Result Value Ref Range   Color, UA     Clarity, UA     Glucose, UA Negative Negative   Bilirubin, UA     Ketones, UA neg    Spec Grav, UA     Blood, UA neg    pH, UA     POC Protein UA Negative Negative, Trace   Urobilinogen, UA     Nitrite, UA neg    Leukocytes, UA Negative Negative   Appearance     Odor      Assessment & Plan:  1) High-risk pregnancy G1P0000 at 2158w2d with an Estimated Date of Delivery: 05/08/18   2) GDM A1, stable  3) CHTN, increasing  Meds: No orders of the defined types were placed in this encounter.   Labs/procedures today: NST  Treatment Plan:   Increase labetalol  to 400 mg 3 times a day, check cbc cmet F/u with U/S BPP 04/02/18  Follow-up: Return in about 4 days (around 04/02/2018) for BPP.  Orders Placed This Encounter  Procedures  . POC Urinalysis Dipstick OB   By signing my name below, I, Arnette NorrisMari Johnson, attest that this documentation has been prepared  under the direction and in the presence of Tilda Burrow, MD. Electronically Signed: Arnette Norris Medical Scribe. 03/29/18. 10:01 AM.  I personally performed the services described in this documentation, which was SCRIBED in my presence. The recorded information has been reviewed and considered accurate. It has been edited as necessary during review. Tilda Burrow, MD

## 2018-03-30 LAB — COMPREHENSIVE METABOLIC PANEL
A/G RATIO: 1.2 (ref 1.2–2.2)
ALBUMIN: 2.9 g/dL — AB (ref 3.5–5.5)
ALT: 16 IU/L (ref 0–32)
AST: 22 IU/L (ref 0–40)
Alkaline Phosphatase: 102 IU/L (ref 39–117)
BUN/Creatinine Ratio: 8 — ABNORMAL LOW (ref 9–23)
BUN: 6 mg/dL (ref 6–20)
Bilirubin Total: 0.2 mg/dL (ref 0.0–1.2)
CALCIUM: 8.7 mg/dL (ref 8.7–10.2)
CO2: 16 mmol/L — ABNORMAL LOW (ref 20–29)
Chloride: 106 mmol/L (ref 96–106)
Creatinine, Ser: 0.71 mg/dL (ref 0.57–1.00)
GFR, EST AFRICAN AMERICAN: 130 mL/min/{1.73_m2} (ref >59)
GFR, EST NON AFRICAN AMERICAN: 113 mL/min/{1.73_m2} (ref >59)
GLOBULIN, TOTAL: 2.4 g/dL (ref 1.5–4.5)
Glucose: 97 mg/dL (ref 65–99)
POTASSIUM: 4.2 mmol/L (ref 3.5–5.2)
SODIUM: 135 mmol/L (ref 134–144)
TOTAL PROTEIN: 5.3 g/dL — AB (ref 6.0–8.5)

## 2018-03-30 LAB — CBC
HEMATOCRIT: 32.4 % — AB (ref 34.0–46.6)
Hemoglobin: 11.1 g/dL (ref 11.1–15.9)
MCH: 28.7 pg (ref 26.6–33.0)
MCHC: 34.3 g/dL (ref 31.5–35.7)
MCV: 84 fL (ref 79–97)
Platelets: 261 10*3/uL (ref 150–450)
RBC: 3.87 x10E6/uL (ref 3.77–5.28)
RDW: 14.1 % (ref 12.3–15.4)
WBC: 10.3 10*3/uL (ref 3.4–10.8)

## 2018-04-02 ENCOUNTER — Ambulatory Visit (INDEPENDENT_AMBULATORY_CARE_PROVIDER_SITE_OTHER): Payer: BLUE CROSS/BLUE SHIELD | Admitting: Women's Health

## 2018-04-02 ENCOUNTER — Encounter: Payer: Self-pay | Admitting: Women's Health

## 2018-04-02 ENCOUNTER — Ambulatory Visit (INDEPENDENT_AMBULATORY_CARE_PROVIDER_SITE_OTHER): Payer: BLUE CROSS/BLUE SHIELD

## 2018-04-02 VITALS — BP 135/82 | HR 72 | Wt 246.0 lb

## 2018-04-02 DIAGNOSIS — Z23 Encounter for immunization: Secondary | ICD-10-CM

## 2018-04-02 DIAGNOSIS — O0990 Supervision of high risk pregnancy, unspecified, unspecified trimester: Secondary | ICD-10-CM

## 2018-04-02 DIAGNOSIS — O0993 Supervision of high risk pregnancy, unspecified, third trimester: Secondary | ICD-10-CM | POA: Diagnosis not present

## 2018-04-02 DIAGNOSIS — O10913 Unspecified pre-existing hypertension complicating pregnancy, third trimester: Secondary | ICD-10-CM

## 2018-04-02 DIAGNOSIS — O24410 Gestational diabetes mellitus in pregnancy, diet controlled: Secondary | ICD-10-CM

## 2018-04-02 DIAGNOSIS — Z1389 Encounter for screening for other disorder: Secondary | ICD-10-CM

## 2018-04-02 DIAGNOSIS — I10 Essential (primary) hypertension: Secondary | ICD-10-CM | POA: Diagnosis not present

## 2018-04-02 DIAGNOSIS — R8271 Bacteriuria: Secondary | ICD-10-CM

## 2018-04-02 DIAGNOSIS — Z3A34 34 weeks gestation of pregnancy: Secondary | ICD-10-CM

## 2018-04-02 DIAGNOSIS — O24419 Gestational diabetes mellitus in pregnancy, unspecified control: Secondary | ICD-10-CM

## 2018-04-02 DIAGNOSIS — R1011 Right upper quadrant pain: Secondary | ICD-10-CM

## 2018-04-02 DIAGNOSIS — Z331 Pregnant state, incidental: Secondary | ICD-10-CM

## 2018-04-02 LAB — POCT URINALYSIS DIPSTICK OB
GLUCOSE, UA: NEGATIVE
KETONES UA: NEGATIVE
LEUKOCYTES UA: NEGATIVE
Nitrite, UA: NEGATIVE
POC,PROTEIN,UA: NEGATIVE
RBC UA: NEGATIVE

## 2018-04-02 MED ORDER — METFORMIN HCL 500 MG PO TABS: 500.0000 mg | ORAL_TABLET | Freq: Every day | ORAL | 3 refills | Status: DC

## 2018-04-02 NOTE — Patient Instructions (Signed)
Kerry Ryan, I greatly value your feedback.  If you receive a survey following your visit with [name] today, we appreciate you taking the time to fill it out.  Thanks, Joellyn Haff, CNM, WHNP-BC   Call the office [phone]) or go to [facility] if:  You begin to have strong, frequent contractions  Your water breaks.  Sometimes it is a big gush of fluid, sometimes it is just a trickle that keeps getting your panties wet or running down your legs  You have vaginal bleeding.  It is normal to have a small amount of spotting if your cervix was checked.   You don't feel your baby moving like normal.  If you don't, get you something to eat and drink and lay down and focus on feeling your baby move.  You should feel at least 10 movements in 2 hours.  If you don't, you should call the office or go to [facility].    Call the office [phone]) or go to [facility] hospital for these signs of pre-eclampsia:  Severe headache that does not go away with Tylenol  Visual changes- seeing spots, double, blurred vision  Pain under your right breast or upper abdomen that does not go away with Tums or heartburn medicine  Nausea and/or vomiting  Severe swelling in your hands, feet, and face    Preterm Labor and Birth Information The normal length of a pregnancy is 39-41 weeks. Preterm labor is when labor starts before 37 completed weeks of pregnancy. What are the risk factors for preterm labor? Preterm labor is more likely to occur in women who:  Have certain infections during pregnancy such as a bladder infection, sexually transmitted infection, or infection inside the uterus (chorioamnionitis).  Have a shorter-than-normal cervix.  Have gone into preterm labor before.  Have had surgery on their cervix.  Are younger than age 77 or older than age 31.  Are African American.  Are pregnant with twins or multiple babies (multiple gestation).  Take street drugs or smoke while pregnant.  Do  not gain enough weight while pregnant.  Became pregnant shortly after having been pregnant.  What are the symptoms of preterm labor? Symptoms of preterm labor include:  Cramps similar to those that can happen during a menstrual period. The cramps may happen with diarrhea.  Pain in the abdomen or lower back.  Regular uterine contractions that may feel like tightening of the abdomen.  A feeling of increased pressure in the pelvis.  Increased watery or bloody mucus discharge from the vagina.  Water breaking (ruptured amniotic sac).  Why is it important to recognize signs of preterm labor? It is important to recognize signs of preterm labor because babies who are born prematurely may not be fully developed. This can put them at an increased risk for:  Long-term (chronic) heart and lung problems.  Difficulty immediately after birth with regulating body systems, including blood sugar, body temperature, heart rate, and breathing rate.  Bleeding in the brain.  Cerebral palsy.  Learning difficulties.  Death.  These risks are highest for babies who are born before 34 weeks of pregnancy. How is preterm labor treated? Treatment depends on the length of your pregnancy, your condition, and the health of your baby. It may involve:  Having a stitch (suture) placed in your cervix to prevent your cervix from opening too early (cerclage).  Taking or being given medicines, such as: ? Hormone medicines. These may be given early in pregnancy to help support the  pregnancy. ? Medicine to stop contractions. ? Medicines to help mature the baby's lungs. These may be prescribed if the risk of delivery is high. ? Medicines to prevent your baby from developing cerebral palsy.  If the labor happens before 34 weeks of pregnancy, you may need to stay in the hospital. What should I do if I think I am in preterm labor? If you think that you are going into preterm labor, call your health care provider  right away. How can I prevent preterm labor in future pregnancies? To increase your chance of having a full-term pregnancy:  Do not use any tobacco products, such as cigarettes, chewing tobacco, and e-cigarettes. If you need help quitting, ask your health care provider.  Do not use street drugs or medicines that have not been prescribed to you during your pregnancy.  Talk with your health care provider before taking any herbal supplements, even if you have been taking them regularly.  Make sure you gain a healthy amount of weight during your pregnancy.  Watch for infection. If you think that you might have an infection, get it checked right away.  Make sure to tell your health care provider if you have gone into preterm labor before.  This information is not intended to replace advice given to you by your health care provider. Make sure you discuss any questions you have with your health care provider. Document Released: 10/07/2003 Document Revised: 12/28/2015 Document Reviewed: 12/08/2015 Elsevier Interactive Patient Education  2018 [employer].

## 2018-04-02 NOTE — Progress Notes (Signed)
[name] 34+6 wks,cephalic,fhr 148 bpm,anterior pl gr 2,afi 13 cm,BPP 8/8,RI .69,.66,.70,.63=84%

## 2018-04-02 NOTE — Progress Notes (Signed)
HIGH-RISK PREGNANCY VISIT Patient name: Kerry Ryan MRN [id number]  Date of birth: [birth year] Chief Complaint:   Routine Prenatal Visit ([name] today)  History of Present Illness:   Kerry Ryan is a 32 y.o. G42P0000 female at [redacted]w[redacted]d with an Estimated Date of Delivery: 05/08/18 being seen today for ongoing management of a high-risk pregnancy complicated by CHTN-currently on Labetalol 200mg  TID, A2DM- meds added today.  Today she reports FBS 88. 99, 99, 102; 2hr pp (still not checking consistently after q meal) 88, 110, 90, 128, 108. Increased Labetalol to 400mg  TID as directed at last visit, stopped yesterday am b/c was making her nauseated and sick, feels much better now that she's back on 200mg  TID. Some RUQ pain yesterday/last night. Denies ha, visual changes, n/v.   Contractions: Not present. Vag. Bleeding: None.  Movement: Present. denies leaking of fluid.  Review of Systems:   Pertinent items are noted in HPI Denies abnormal vaginal discharge w/ itching/odor/irritation, headaches, visual changes, shortness of breath, chest pain, abdominal pain, severe nausea/vomiting, or problems with urination or bowel movements unless otherwise stated above. Pertinent History Reviewed:  Reviewed past medical,surgical, social, obstetrical and family history.  Reviewed problem list, medications and allergies. Physical Assessment:   Vitals:   04/02/18 1355  BP: 135/82  Pulse: 72  Weight: 246 lb (111.6 kg)  Body mass index is 51.41 kg/m.           Physical Examination:   General appearance: alert, well appearing, and in no distress  Mental status: alert, oriented to person, place, and time  Skin: warm & dry   Extremities: Edema: Trace    Cardiovascular: normal heart rate noted  Respiratory: normal respiratory effort, no distress  Abdomen: gravid, soft, mild tenderness RUQ  Pelvic: Cervical exam deferred         Fetal Status: Fetal Heart Rate (bpm): 148 u/s Fundal Height: 37 cm Movement:  Present    Fetal Surveillance Testing today: [name] 34+6 wks,cephalic,fhr 148 bpm,anterior pl gr 2,afi 13 cm,BPP 8/8,RI .69,.66,.70,.63=84%  Results for orders placed or performed in visit on 04/02/18 (from the past 24 hour(s))  POC Urinalysis Dipstick OB   Collection Time: 04/02/18  1:56 PM  Result Value Ref Range   Color, UA     Clarity, UA     Glucose, UA Negative Negative   Bilirubin, UA     Ketones, UA neg    Spec Grav, UA     Blood, UA neg    pH, UA     POC Protein UA Negative Negative, Trace   Urobilinogen, UA     Nitrite, UA neg    Leukocytes, UA Negative Negative   Appearance     Odor      Assessment & Plan:  1) High-risk pregnancy G1P0000 at [redacted]w[redacted]d with an Estimated Date of Delivery: 05/08/18   2) CHTN, stable on Labetalol 200mg  TID, some RUQ tenderness/pain yesterday, will repeat pre-e labs. Reviewed pre-e s/s, reasons to seek care. Continue baby ASA. Borderline dopplers today.   3) A2DM, unstable, rx metformin 500mg  pm today  Meds:  Meds ordered this encounter  Medications  . metFORMIN (GLUCOPHAGE) 500 MG tablet    Sig: Take 1 tablet (500 mg total) by mouth daily with supper.    Dispense:  30 tablet    Refill:  3    Order Specific Question:   Supervising Provider    Answer:   Duane Lope H [[id number]]    Labs/procedures today: bpp/dopp u/s, flu  shot  Treatment Plan:  2x/wk nst alt w/ bpp/dopp, efw @ 35, 38wks, IOL @ 39wks or as indicated  Reviewed: Preterm labor symptoms and general obstetric precautions including but not limited to vaginal bleeding, contractions, leaking of fluid and fetal movement were reviewed in detail with the patient.  All questions were answered.  Follow-up: Return for As scheduled Friday for HROB/NST.  Orders Placed This Encounter  Procedures  . Flu Vaccine QUAD 36+ mos IM (Fluarix, Quad PF)  . CBC  . Comprehensive metabolic panel  . Protein / creatinine ratio, urine  . POC Urinalysis Dipstick OB   Cheral Marker CNM,  [facility] 04/02/2018 3:22 PM

## 2018-04-03 ENCOUNTER — Other Ambulatory Visit: Payer: Self-pay | Admitting: Women's Health

## 2018-04-03 LAB — COMPREHENSIVE METABOLIC PANEL
ALK PHOS: 100 IU/L (ref 39–117)
ALT: 13 IU/L (ref 0–32)
AST: 15 IU/L (ref 0–40)
Albumin/Globulin Ratio: 1.2 (ref 1.2–2.2)
Albumin: 2.9 g/dL — ABNORMAL LOW (ref 3.5–5.5)
BUN/Creatinine Ratio: 10 (ref 9–23)
BUN: 7 mg/dL (ref 6–20)
CHLORIDE: 108 mmol/L — AB (ref 96–106)
CO2: 17 mmol/L — AB (ref 20–29)
Calcium: 8.3 mg/dL — ABNORMAL LOW (ref 8.7–10.2)
Creatinine, Ser: 0.69 mg/dL (ref 0.57–1.00)
GFR calc Af Amer: 133 mL/min/{1.73_m2} (ref >59)
GFR calc non Af Amer: 116 mL/min/{1.73_m2} (ref >59)
GLUCOSE: 84 mg/dL (ref 65–99)
Globulin, Total: 2.5 g/dL (ref 1.5–4.5)
Potassium: 4.4 mmol/L (ref 3.5–5.2)
Sodium: 138 mmol/L (ref 134–144)
Total Protein: 5.4 g/dL — ABNORMAL LOW (ref 6.0–8.5)

## 2018-04-03 LAB — CBC
Hematocrit: 32.7 % — ABNORMAL LOW (ref 34.0–46.6)
Hemoglobin: 10.4 g/dL — ABNORMAL LOW (ref 11.1–15.9)
MCH: 28 pg (ref 26.6–33.0)
MCHC: 31.8 g/dL (ref 31.5–35.7)
MCV: 88 fL (ref 79–97)
PLATELETS: 263 10*3/uL (ref 150–450)
RBC: 3.71 x10E6/uL — AB (ref 3.77–5.28)
RDW: 15.5 % — ABNORMAL HIGH (ref 12.3–15.4)
WBC: 10 10*3/uL (ref 3.4–10.8)

## 2018-04-03 LAB — PROTEIN / CREATININE RATIO, URINE
CREATININE, UR: 166 mg/dL
PROTEIN UR: 23.6 mg/dL
PROTEIN/CREAT RATIO: 142 mg/g{creat} (ref 0–200)

## 2018-04-03 MED ORDER — FERROUS SULFATE 325 (65 FE) MG PO TABS: 325.0000 mg | ORAL_TABLET | Freq: Two times a day (BID) | ORAL | 3 refills | Status: AC

## 2018-04-05 ENCOUNTER — Encounter: Payer: Self-pay | Admitting: Obstetrics and Gynecology

## 2018-04-05 ENCOUNTER — Ambulatory Visit (INDEPENDENT_AMBULATORY_CARE_PROVIDER_SITE_OTHER): Payer: BLUE CROSS/BLUE SHIELD | Admitting: Obstetrics and Gynecology

## 2018-04-05 ENCOUNTER — Other Ambulatory Visit: Payer: BLUE CROSS/BLUE SHIELD | Admitting: Obstetrics and Gynecology

## 2018-04-05 VITALS — BP 137/81 | HR 75 | Wt 242.0 lb

## 2018-04-05 DIAGNOSIS — O24419 Gestational diabetes mellitus in pregnancy, unspecified control: Secondary | ICD-10-CM

## 2018-04-05 DIAGNOSIS — Z331 Pregnant state, incidental: Secondary | ICD-10-CM

## 2018-04-05 DIAGNOSIS — R8271 Bacteriuria: Secondary | ICD-10-CM

## 2018-04-05 DIAGNOSIS — O10913 Unspecified pre-existing hypertension complicating pregnancy, third trimester: Secondary | ICD-10-CM

## 2018-04-05 DIAGNOSIS — Z1389 Encounter for screening for other disorder: Secondary | ICD-10-CM

## 2018-04-05 DIAGNOSIS — I10 Essential (primary) hypertension: Secondary | ICD-10-CM

## 2018-04-05 DIAGNOSIS — Z3A35 35 weeks gestation of pregnancy: Secondary | ICD-10-CM

## 2018-04-05 DIAGNOSIS — O0990 Supervision of high risk pregnancy, unspecified, unspecified trimester: Secondary | ICD-10-CM

## 2018-04-05 LAB — POCT URINALYSIS DIPSTICK OB
Blood, UA: NEGATIVE
Glucose, UA: NEGATIVE
Ketones, UA: NEGATIVE
LEUKOCYTES UA: NEGATIVE
NITRITE UA: NEGATIVE
PROTEIN: NEGATIVE

## 2018-04-05 NOTE — Progress Notes (Signed)
Patient ID: Kerry Ryan, female   DOB: May 05, 1986, 32 y.o.   MRN: [id number]    [facility] PREGNANCY VISIT Patient name: Kerry Ryan MRN [id number]  Date of birth: August 01, 1985 Chief Complaint:   High Risk Gestation (NST/ room # 8)  History of Present Illness:   CARINNA ROHAL is a 32 y.o. G35P0000 female at [redacted]w[redacted]d with an Estimated Date of Delivery: 05/08/18 being seen today for ongoing management of a high-risk pregnancy complicated by chronic HTN, gestational DMA71.  She went back to 200 mg labetalol 3 times a day. 400 mg TID made her nauseous   Today she reports no complaints. Contractions: Not present.  .  Movement: Present. denies leaking of fluid.  Review of Systems:   Pertinent items are noted in HPI Denies abnormal vaginal discharge w/ itching/odor/irritation, headaches, visual changes, shortness of breath, chest pain, abdominal pain, severe nausea/vomiting, or problems with urination or bowel movements unless otherwise stated above. Pertinent History Reviewed:  Reviewed past medical,surgical, social, obstetrical and family history.  Reviewed problem list, medications and allergies. Physical Assessment:   Vitals:   04/05/18 0906  BP: 137/81  Pulse: 75  Weight: 242 lb (109.8 kg)  Body mass index is 50.58 kg/m.           Physical Examination:   General appearance: alert, well appearing, and in no distress, oriented to person, place, and time and overweight  Mental status: alert, oriented to person, place, and time, normal mood, behavior, speech, dress, motor activity, and thought processes, affect appropriate to mood  Skin: warm & dry   Extremities: Edema: Mild pitting, slight indentation    Cardiovascular: normal heart rate noted  Respiratory: normal respiratory effort, no distress  Abdomen: gravid, soft, non-tender  Pelvic: Cervical exam deferred         Fetal Status:   Fundal Height: 35 cm Movement: Present    Fetal Surveillance Testing today: NST  Results for  orders placed or performed in visit on 04/05/18 (from the past 24 hour(s))  POC Urinalysis Dipstick OB   Collection Time: 04/05/18  9:14 AM  Result Value Ref Range   Color, UA     Clarity, UA     Glucose, UA Negative Negative   Bilirubin, UA     Ketones, UA neg    Spec Grav, UA     Blood, UA neg    pH, UA     POC Protein UA Negative Negative, Trace   Urobilinogen, UA     Nitrite, UA neg    Leukocytes, UA Negative Negative   Appearance     Odor      Assessment & Plan:  1) High-risk pregnancy G1P0000 at [redacted]w[redacted]d with an Estimated Date of Delivery: 05/08/18   2) GDM A2, stable  3) CHTN, stable, 200 mg labetalol TID  Meds: No orders of the defined types were placed in this encounter.   Labs/procedures today: NST  Treatment Plan:  F/u for BPP on 04/09/18   Follow-up: Return in about 4 days (around 04/09/2018) for BPP.  Orders Placed This Encounter  Procedures  . POC Urinalysis Dipstick OB   By signing my name below, I, Arnette Norris, attest that this documentation has been prepared under the direction and in the presence of Tilda Burrow, MD. Electronically Signed: Arnette Norris Medical Scribe. 04/05/18. 10:15 AM.  I personally performed the services described in this documentation, which was SCRIBED in my presence. The recorded information has been reviewed and considered accurate. It  has been edited as necessary during review. Jonnie Kind, MD

## 2018-04-07 ENCOUNTER — Inpatient Hospital Stay (HOSPITAL_COMMUNITY)
Admission: AD | Admit: 2018-04-07 | Discharge: 2018-04-07 | Disposition: A | Discharge status: Home / Self Care | Payer: BLUE CROSS/BLUE SHIELD | Source: Ambulatory Visit | Attending: Obstetrics and Gynecology | Admitting: Obstetrics and Gynecology

## 2018-04-07 ENCOUNTER — Encounter (HOSPITAL_COMMUNITY): Payer: Self-pay | Admitting: *Deleted

## 2018-04-07 DIAGNOSIS — B373 Candidiasis of vulva and vagina: Secondary | ICD-10-CM | POA: Diagnosis not present

## 2018-04-07 DIAGNOSIS — O99213 Obesity complicating pregnancy, third trimester: Secondary | ICD-10-CM | POA: Insufficient documentation

## 2018-04-07 DIAGNOSIS — O24419 Gestational diabetes mellitus in pregnancy, unspecified control: Secondary | ICD-10-CM | POA: Insufficient documentation

## 2018-04-07 DIAGNOSIS — Z3A35 35 weeks gestation of pregnancy: Secondary | ICD-10-CM | POA: Insufficient documentation

## 2018-04-07 DIAGNOSIS — Z0371 Encounter for suspected problem with amniotic cavity and membrane ruled out: Secondary | ICD-10-CM | POA: Diagnosis present

## 2018-04-07 DIAGNOSIS — O163 Unspecified maternal hypertension, third trimester: Secondary | ICD-10-CM | POA: Diagnosis not present

## 2018-04-07 DIAGNOSIS — I10 Essential (primary) hypertension: Secondary | ICD-10-CM

## 2018-04-07 DIAGNOSIS — O98813 Other maternal infectious and parasitic diseases complicating pregnancy, third trimester: Secondary | ICD-10-CM | POA: Insufficient documentation

## 2018-04-07 DIAGNOSIS — R8271 Bacteriuria: Secondary | ICD-10-CM

## 2018-04-07 DIAGNOSIS — B3731 Acute candidiasis of vulva and vagina: Secondary | ICD-10-CM

## 2018-04-07 DIAGNOSIS — O0990 Supervision of high risk pregnancy, unspecified, unspecified trimester: Secondary | ICD-10-CM

## 2018-04-07 LAB — WET PREP, GENITAL
CLUE CELLS WET PREP: NONE SEEN
Sperm: NONE SEEN
TRICH WET PREP: NONE SEEN
YEAST WET PREP: NONE SEEN

## 2018-04-07 LAB — POCT FERN TEST: POCT FERN TEST: NEGATIVE

## 2018-04-07 MED ORDER — TERCONAZOLE 0.4 % VA CREA: 1.0000 | TOPICAL_CREAM | Freq: Every day | VAGINAL | 0 refills | Status: DC

## 2018-04-07 NOTE — MAU Note (Signed)
Pt states she has been having discharge for the last several days but it was more today and watery around 1530. Some back, rib, and pelvic pain but she said that is normal for her.  Denies VB.

## 2018-04-07 NOTE — Discharge Instructions (Signed)

## 2018-04-07 NOTE — MAU Provider Note (Signed)
Chief Complaint  Patient presents with  . Rupture of Membranes     First Provider Initiated Contact with Patient 04/07/18 1933      S: Kerry Ryan  is a 33 y.o. y.o. year old G12P0000 female at [redacted]w[redacted]d weeks gestation who presents to MAU reporting leaking of clear fluid since 1530. Also reports feeling of underwear being wet "for a while". Has not had any testing for ROM.    Contractions: Denies Vaginal bleeding: Denies Fetal movement: Nml Denies fever, chills, abd pain, vaginal odor or irritation.   Patient Active Problem List   Diagnosis Date Noted  . Gestational diabetes mellitus, class A2 02/07/2018  . GBS bacteriuria 10/17/2017  . Supervision of high risk pregnancy, antepartum 10/11/2017  . Chronic hypertension 10/11/2017  . History of loop electrosurgical excision procedure (LEEP) of cervix affecting pregnancy, antepartum 09/18/2017  . CIN II (cervical intraepithelial neoplasia II) 09/08/2015  . Morbid obesity (HCC) 01/29/2013  . Depression with anxiety 01/29/2013     O:  Patient Vitals for the past 24 hrs:  BP Temp Temp src Pulse Resp SpO2  04/07/18 1846 (!) 147/82 97.9 F (36.6 C) Oral 75 17 99 %   General: NAD Heart: Regular rate Lungs: Normal rate and effort Abd: Soft, NT, Gravid, S=D Pelvic: NEFG, Neg pooling, no blood. Moderate amount of thick, white odorless discharge  Cervix visually closed  EFM: 130, Moderate variability, 15 x 15 accelerations, no decelerations Toco: None  Neg Fern Results for orders placed or performed during the hospital encounter of 04/07/18 (from the past 24 hour(s))  Fern Test     Status: None   Collection Time: 04/07/18  7:08 PM  Result Value Ref Range   POCT Fern Test Negative = intact amniotic membranes   Wet prep, genital     Status: Abnormal   Collection Time: 04/07/18  7:42 PM  Result Value Ref Range   Yeast Wet Prep HPF POC NONE SEEN NONE SEEN   Trich, Wet Prep NONE SEEN NONE SEEN   Clue Cells Wet Prep HPF POC NONE  SEEN NONE SEEN   WBC, Wet Prep HPF POC MODERATE (A) NONE SEEN   Sperm NONE SEEN     A: [redacted]w[redacted]d week IUP No evidence of SROM or labor Clinical Dx of vaginal yeast infection FHR reactive  P: Discharge home in stable condition. Preterm labor precautions and fetal kick counts. Rx Terazol 7 Follow-up as scheduled for prenatal visit or sooner as needed if symptoms worsen. Return to maternity admissions as needed if symptoms worsen.  Katrinka Blazing, [place], [place] 04/07/2018 8:27 PM  2

## 2018-04-09 ENCOUNTER — Ambulatory Visit (INDEPENDENT_AMBULATORY_CARE_PROVIDER_SITE_OTHER): Payer: BLUE CROSS/BLUE SHIELD

## 2018-04-09 ENCOUNTER — Encounter: Payer: Self-pay | Admitting: Advanced Practice Midwife

## 2018-04-09 ENCOUNTER — Other Ambulatory Visit: Payer: Self-pay

## 2018-04-09 ENCOUNTER — Ambulatory Visit (INDEPENDENT_AMBULATORY_CARE_PROVIDER_SITE_OTHER): Payer: BLUE CROSS/BLUE SHIELD | Admitting: Advanced Practice Midwife

## 2018-04-09 VITALS — BP 142/86 | HR 73 | Wt 241.0 lb

## 2018-04-09 DIAGNOSIS — O0993 Supervision of high risk pregnancy, unspecified, third trimester: Secondary | ICD-10-CM

## 2018-04-09 DIAGNOSIS — Z1389 Encounter for screening for other disorder: Secondary | ICD-10-CM

## 2018-04-09 DIAGNOSIS — R8271 Bacteriuria: Secondary | ICD-10-CM

## 2018-04-09 DIAGNOSIS — I10 Essential (primary) hypertension: Secondary | ICD-10-CM | POA: Diagnosis not present

## 2018-04-09 DIAGNOSIS — O10913 Unspecified pre-existing hypertension complicating pregnancy, third trimester: Secondary | ICD-10-CM

## 2018-04-09 DIAGNOSIS — O24419 Gestational diabetes mellitus in pregnancy, unspecified control: Secondary | ICD-10-CM

## 2018-04-09 DIAGNOSIS — Z331 Pregnant state, incidental: Secondary | ICD-10-CM

## 2018-04-09 DIAGNOSIS — O0990 Supervision of high risk pregnancy, unspecified, unspecified trimester: Secondary | ICD-10-CM

## 2018-04-09 DIAGNOSIS — Z3A35 35 weeks gestation of pregnancy: Secondary | ICD-10-CM

## 2018-04-09 DIAGNOSIS — O24415 Gestational diabetes mellitus in pregnancy, controlled by oral hypoglycemic drugs: Secondary | ICD-10-CM

## 2018-04-09 LAB — POCT URINALYSIS DIPSTICK OB
Blood, UA: NEGATIVE
GLUCOSE, UA: NEGATIVE
Ketones, UA: NEGATIVE
LEUKOCYTES UA: NEGATIVE
Nitrite, UA: NEGATIVE
POC,PROTEIN,UA: NEGATIVE

## 2018-04-09 NOTE — Progress Notes (Signed)
HIGH-RISK PREGNANCY VISIT Patient name: Kerry Ryan MRN [id number]  Date of birth: [birth year] Chief Complaint:   High Risk Gestation (u/s today)  History of Present Illness:   Alyria A Cordone is a 32 y.o. G66P0000 female at [redacted]w[redacted]d with an Estimated Date of Delivery: 05/08/18 being seen today for ongoing management of a high-risk pregnancy complicated by chronic HTN, gestational DMA66.  Today she reports no complaints. Contractions: Not present. Vag. Bleeding: None.  Movement: Present. denies leaking of fluid. Seen in MAU 9/8 and dx w/ BV, hasn't started meds yet.  Review of Systems:   Pertinent items are noted in HPI Denies  headaches, visual changes, shortness of breath, chest pain, abdominal pain, severe nausea/vomiting, or problems with urination or bowel movements unless otherwise stated above.    Pertinent History Reviewed:  Medical & Surgical Hx:   Past Medical History:  Diagnosis Date  . Acne   . Anxiety   . Chlamydia infection   . Depression   . GERD (gastroesophageal reflux disease)   . Vaginal Pap smear, abnormal    Past Surgical History:  Procedure Laterality Date  . COLPOSCOPY    . LEEP    . NO PAST SURGERIES     Family History  Problem Relation Age of Onset  . Hypertension Mother   . Sarcoidosis Mother   . Cancer Paternal Grandmother        breast  . Other Paternal Grandmother        gangrene  . Other Maternal Grandmother        has a pacemaker  . Irritable bowel syndrome Maternal Grandmother   . Kidney failure Maternal Grandfather   . Other Maternal Grandfather        back problems    Current Outpatient Medications:  .  aspirin EC 81 MG tablet, Take 2 tablets (162 mg total) by mouth daily., Disp: 60 tablet, Rfl: 6 .  ferrous sulfate 325 (65 FE) MG tablet, Take 1 tablet (325 mg total) by mouth 2 (two) times daily with a meal., Disp: 60 tablet, Rfl: 3 .  fexofenadine (ALLEGRA) 180 MG tablet, Take 180 mg by mouth daily., Disp: , Rfl:  .  labetalol  (NORMODYNE) 200 MG tablet, Take 1 tablet (200 mg total) by mouth 3 (three) times daily., Disp: 90 tablet, Rfl: 3 .  metFORMIN (GLUCOPHAGE) 500 MG tablet, Take 1 tablet (500 mg total) by mouth daily with supper., Disp: 30 tablet, Rfl: 3 .  omeprazole (PRILOSEC) 20 MG capsule, Take 1 capsule (20 mg total) by mouth daily., Disp: 30 capsule, Rfl: 5 .  Prenatal MV & Min w/FA-DHA (PRENATAL ADULT GUMMY/DHA/FA PO), Take by mouth., Disp: , Rfl:  .  promethazine (PHENERGAN) 25 MG tablet, Take 1 tablet (25 mg total) by mouth every 6 (six) hours as needed for nausea or vomiting., Disp: 30 tablet, Rfl: 1 .  terconazole (TERAZOL 7) 0.4 % vaginal cream, Place 1 applicator vaginally at bedtime. (Patient not taking: Reported on 04/09/2018), Disp: 45 g, Rfl: 0 Social History: Reviewed -  reports that she has never smoked. She has never used smokeless tobacco.  FBS all but one <95 and all but on e < 120 and 2 hr pp   Physical Assessment:   Vitals:   04/09/18 1356  BP: (!) 142/86  Pulse: 73  Weight: 241 lb (109.3 kg)  Body mass index is 50.37 kg/m.           Physical Examination:   General appearance: alert, well appearing,  and in no distress  Mental status: alert, oriented to person, place, and time  Skin: warm & dry   Extremities: Edema: Trace    Cardiovascular: normal heart rate noted  Respiratory: normal respiratory effort, no distress  Abdomen: gravid, soft, non-tender  Pelvic: Cervical exam deferred         Fetal Status:     Movement: Present    Fetal Surveillance Testing today: [name] 35+6 wks,cephalic,BPP 8/8,FHR 142 bpm,bilat adnexa's wnl,anterior pl gr 2,afi 16 cm,RI .57,.59,.55=50%,EFW 2477 G 20%  Results for orders placed or performed in visit on 04/09/18 (from the past 24 hour(s))  POC Urinalysis Dipstick OB   Collection Time: 04/09/18  1:56 PM  Result Value Ref Range   Color, UA     Clarity, UA     Glucose, UA Negative Negative   Bilirubin, UA     Ketones, UA neg    Spec Grav, UA      Blood, UA neg    pH, UA     POC Protein UA Negative Negative, Trace   Urobilinogen, UA     Nitrite, UA neg    Leukocytes, UA Negative Negative   Appearance     Odor      Assessment & Plan:  1) High-risk pregnancy G1P0000 at [redacted]w[redacted]d with an Estimated Date of Delivery: 05/08/18   2) CHTN, stablelabetalol 200mg  BID, ASA 162mg  qd  3) A2DM, stablemetformin 547m gqhs    Treatment Plan:  Twice weekly testing, IOL 39 weeks  Follow-up: No follow-ups on file.  Orders Placed This Encounter  Procedures  . POC Urinalysis Dipstick OB   [name] CNM 04/09/2018 2:07 PM

## 2018-04-09 NOTE — Progress Notes (Signed)
[name] 35+6 wks,cephalic,BPP 8/8,FHR 142 bpm,bilat adnexa's wnl,anterior pl gr 2,afi 16 cm,RI .57,.59,.55=50%,EFW 2477 G 20%

## 2018-04-09 NOTE — Patient Instructions (Signed)

## 2018-04-12 ENCOUNTER — Encounter: Payer: Self-pay | Admitting: Obstetrics and Gynecology

## 2018-04-12 ENCOUNTER — Ambulatory Visit (INDEPENDENT_AMBULATORY_CARE_PROVIDER_SITE_OTHER): Payer: BLUE CROSS/BLUE SHIELD | Admitting: Obstetrics and Gynecology

## 2018-04-12 VITALS — BP 123/84 | HR 76 | Wt 241.0 lb

## 2018-04-12 DIAGNOSIS — Z331 Pregnant state, incidental: Secondary | ICD-10-CM

## 2018-04-12 DIAGNOSIS — I10 Essential (primary) hypertension: Secondary | ICD-10-CM

## 2018-04-12 DIAGNOSIS — O10913 Unspecified pre-existing hypertension complicating pregnancy, third trimester: Secondary | ICD-10-CM | POA: Diagnosis not present

## 2018-04-12 DIAGNOSIS — O24419 Gestational diabetes mellitus in pregnancy, unspecified control: Secondary | ICD-10-CM | POA: Diagnosis not present

## 2018-04-12 DIAGNOSIS — Z3A36 36 weeks gestation of pregnancy: Secondary | ICD-10-CM

## 2018-04-12 DIAGNOSIS — O0993 Supervision of high risk pregnancy, unspecified, third trimester: Secondary | ICD-10-CM

## 2018-04-12 DIAGNOSIS — R8271 Bacteriuria: Secondary | ICD-10-CM

## 2018-04-12 DIAGNOSIS — Z1389 Encounter for screening for other disorder: Secondary | ICD-10-CM

## 2018-04-12 LAB — POCT URINALYSIS DIPSTICK OB
Blood, UA: NEGATIVE
Glucose, UA: NEGATIVE
Ketones, UA: NEGATIVE
Leukocytes, UA: NEGATIVE
NITRITE UA: NEGATIVE
PROTEIN: NEGATIVE

## 2018-04-12 NOTE — Progress Notes (Signed)
Patient ID: Kerry Ryan, female   DOB: August 11, 1985, 32 y.o.   MRN: [id number]    [facility] PREGNANCY VISIT Patient name: Kerry Ryan MRN [id number]  Date of birth: August 23, 1985 Chief Complaint:   High Risk Gestation (NST/ room # 12)  History of Present Illness:   CHARLETT Ryan is a 32 y.o. G44P0000 female at [redacted]w[redacted]d with an Estimated Date of Delivery: 05/08/18 being seen today for ongoing management of a high-risk pregnancy complicated by chronic HTN, gestational DMA68. Brought blood sugars with her and is also using baby scripts APP. Fasting >100  elevated this morning, is unsure why it was elevated, most 70-85. 2 in the last 2 weeks fasting were above 100. In last two weeks 2 blood sugars were high one was 122 and 126 Today she reports headaches but only during the morning. Contractions: Not present.  .  Movement: Present. denies leaking of fluid.  Review of Systems:   Pertinent items are noted in HPI Denies abnormal vaginal discharge w/ itching/odor/irritation, headaches, visual changes, shortness of breath, chest pain, abdominal pain, severe nausea/vomiting, or problems with urination or bowel movements unless otherwise stated above. Pertinent History Reviewed:  Reviewed past medical,surgical, social, obstetrical and family history.  Reviewed problem list, medications and allergies. Physical Assessment:   Vitals:   04/12/18 0912  BP: 123/84  Pulse: 76  Weight: 241 lb (109.3 kg)  Body mass index is 50.37 kg/m.           Physical Examination:   General appearance: alert, well appearing, and in no distress, oriented to person, place, and time and overweight  Mental status: alert, oriented to person, place, and time, normal mood, behavior, speech, dress, motor activity, and thought processes, affect appropriate to mood  Skin: warm & dry   Extremities: Edema: Trace    Cardiovascular: normal heart rate noted  Respiratory: normal respiratory effort, no distress  Abdomen: gravid,  soft, non-tender  Pelvic: Cervical exam deferred         Fetal Status:     Movement: Present    Fetal Surveillance Testing today: NST reactive   Results for orders placed or performed in visit on 04/12/18 (from the past 24 hour(s))  POC Urinalysis Dipstick OB   Collection Time: 04/12/18  9:15 AM  Result Value Ref Range   Color, UA     Clarity, UA     Glucose, UA Negative Negative   Bilirubin, UA     Ketones, UA neg    Spec Grav, UA     Blood, UA neg    pH, UA     POC Protein UA Negative Negative, Trace   Urobilinogen, UA     Nitrite, UA neg    Leukocytes, UA Negative Negative   Appearance     Odor      Assessment & Plan:  1) High-risk pregnancy G1P0000 at [redacted]w[redacted]d with an Estimated Date of Delivery: 05/08/18   2) CHTN, stable, continue labetalol meds 200 mg TID  3) GDAM2, stable  Meds: No orders of the defined types were placed in this encounter.   Labs/procedures today: NST  Treatment Plan:  F/u 9/17 for BPP & HROB  Follow-up: Return in about 4 days (around 04/16/2018) for BPP, HROB.  Orders Placed This Encounter  Procedures  . POC Urinalysis Dipstick OB   By signing my name below, I, Arnette Norris, attest that this documentation has been prepared under the direction and in the presence of Tilda Burrow, MD. Electronically Signed:  Arnette NorrisMari Johnson Medical Scribe. 04/12/18. 10:14 AM.  I personally performed the services described in this documentation, which was SCRIBED in my presence. The recorded information has been reviewed and considered accurate. It has been edited as necessary during review. Tilda BurrowJohn V Lakara Weiland, MD

## 2018-04-16 ENCOUNTER — Other Ambulatory Visit: Payer: Self-pay | Admitting: Obstetrics and Gynecology

## 2018-04-16 ENCOUNTER — Other Ambulatory Visit: Payer: BLUE CROSS/BLUE SHIELD

## 2018-04-16 ENCOUNTER — Encounter: Payer: Self-pay | Admitting: Advanced Practice Midwife

## 2018-04-16 ENCOUNTER — Ambulatory Visit (INDEPENDENT_AMBULATORY_CARE_PROVIDER_SITE_OTHER): Payer: BLUE CROSS/BLUE SHIELD | Admitting: Advanced Practice Midwife

## 2018-04-16 ENCOUNTER — Ambulatory Visit (INDEPENDENT_AMBULATORY_CARE_PROVIDER_SITE_OTHER): Payer: BLUE CROSS/BLUE SHIELD

## 2018-04-16 VITALS — BP 150/110 | HR 68 | Wt 240.0 lb

## 2018-04-16 DIAGNOSIS — O24419 Gestational diabetes mellitus in pregnancy, unspecified control: Secondary | ICD-10-CM

## 2018-04-16 DIAGNOSIS — O0993 Supervision of high risk pregnancy, unspecified, third trimester: Secondary | ICD-10-CM | POA: Diagnosis not present

## 2018-04-16 DIAGNOSIS — O0990 Supervision of high risk pregnancy, unspecified, unspecified trimester: Secondary | ICD-10-CM

## 2018-04-16 DIAGNOSIS — I10 Essential (primary) hypertension: Secondary | ICD-10-CM | POA: Diagnosis not present

## 2018-04-16 DIAGNOSIS — R8271 Bacteriuria: Secondary | ICD-10-CM

## 2018-04-16 DIAGNOSIS — Z1389 Encounter for screening for other disorder: Secondary | ICD-10-CM

## 2018-04-16 DIAGNOSIS — O10913 Unspecified pre-existing hypertension complicating pregnancy, third trimester: Secondary | ICD-10-CM

## 2018-04-16 DIAGNOSIS — Z3A36 36 weeks gestation of pregnancy: Secondary | ICD-10-CM | POA: Diagnosis not present

## 2018-04-16 DIAGNOSIS — Z331 Pregnant state, incidental: Secondary | ICD-10-CM

## 2018-04-16 LAB — POCT URINALYSIS DIPSTICK OB
Blood, UA: NEGATIVE
Glucose, UA: NEGATIVE
KETONES UA: NEGATIVE
Leukocytes, UA: NEGATIVE
NITRITE UA: NEGATIVE
PROTEIN: NEGATIVE

## 2018-04-16 NOTE — Addendum Note (Signed)
Addended by: Jacklyn ShellRESENZO-DISHMON, Oswell Say on: 04/16/2018 11:46 AM   Modules accepted: Orders, SmartSet

## 2018-04-16 NOTE — Progress Notes (Signed)
HIGH-RISK PREGNANCY VISIT Patient name: Mitchell HeirCarina A Cielo MRN [id number]  Date of birth: July 01, 1986 Chief Complaint:   High Risk Gestation (ultrasound Bpp / gc- chl)  History of Present Illness:   Kerry Ryan is a 32 y.o. 911P0000 female at 8474w6d with an Estimated Date of Delivery: 05/08/18 being seen today for ongoing management of a high-risk pregnancy complicated by chronic HTN, gestational 34DMA2.  Today she reports no complaints. Contractions: Not present.  .  Movement: Present. denies leaking of fluid.  Review of Systems:   Pertinent items are noted in HPI Denies abnormal vaginal discharge w/ itching/odor/irritation, headaches, visual changes, shortness of breath, chest pain, abdominal pain, severe nausea/vomiting, or problems with urination or bowel movements unless otherwise stated above.    Pertinent History Reviewed:  Medical & Surgical Hx:   Past Medical History:  Diagnosis Date  . Acne   . Anxiety   . Chlamydia infection   . Depression   . GERD (gastroesophageal reflux disease)   . Vaginal Pap smear, abnormal    Past Surgical History:  Procedure Laterality Date  . COLPOSCOPY    . LEEP    . NO PAST SURGERIES     Family History  Problem Relation Age of Onset  . Hypertension Mother   . Sarcoidosis Mother   . Cancer Paternal Grandmother        breast  . Other Paternal Grandmother        gangrene  . Other Maternal Grandmother        has a pacemaker  . Irritable bowel syndrome Maternal Grandmother   . Kidney failure Maternal Grandfather   . Other Maternal Grandfather        back problems    Current Outpatient Medications:  .  aspirin EC 81 MG tablet, Take 2 tablets (162 mg total) by mouth daily., Disp: 60 tablet, Rfl: 6 .  ferrous sulfate 325 (65 FE) MG tablet, Take 1 tablet (325 mg total) by mouth 2 (two) times daily with a meal., Disp: 60 tablet, Rfl: 3 .  fexofenadine (ALLEGRA) 180 MG tablet, Take 180 mg by mouth daily., Disp: , Rfl:  .  labetalol  (NORMODYNE) 200 MG tablet, Take 1 tablet (200 mg total) by mouth 3 (three) times daily., Disp: 90 tablet, Rfl: 3 .  metFORMIN (GLUCOPHAGE) 500 MG tablet, Take 1 tablet (500 mg total) by mouth daily with supper., Disp: 30 tablet, Rfl: 3 .  omeprazole (PRILOSEC) 20 MG capsule, Take 1 capsule (20 mg total) by mouth daily., Disp: 30 capsule, Rfl: 5 .  Prenatal MV & Min w/FA-DHA (PRENATAL ADULT GUMMY/DHA/FA PO), Take by mouth., Disp: , Rfl:  .  terconazole (TERAZOL 7) 0.4 % vaginal cream, Place 1 applicator vaginally at bedtime., Disp: 45 g, Rfl: 0 .  promethazine (PHENERGAN) 25 MG tablet, Take 1 tablet (25 mg total) by mouth every 6 (six) hours as needed for nausea or vomiting. (Patient not taking: Reported on 04/12/2018), Disp: 30 tablet, Rfl: 1 Social History: Reviewed -  reports that she has never smoked. She has never used smokeless tobacco.   Physical Assessment:   Vitals:   04/16/18 1100 04/16/18 1142  BP: (!) 143/103 (!) 150/110  Pulse: 68   Weight: 240 lb (108.9 kg)   Body mass index is 50.16 kg/m.           Physical Examination:   General appearance: alert, well appearing, and in no distress  Mental status: alert, oriented to person, place, and time  Skin: warm &  dry   Extremities: Edema: Trace    Cardiovascular: normal heart rate noted  Respiratory: normal respiratory effort, no distress  Abdomen: gravid, soft, non-tender  Pelvic: Cervical exam performed         Fetal Status:     Movement: Present    Fetal Surveillance Testing today: [name] 36+6 wks,cephalic,fhr 132 bpm,anterior pl gr 2,BPP 8/8,AFI 6.2 cm,RI .70,.60,.60=81%  Results for orders placed or performed in visit on 04/16/18 (from the past 24 hour(s))  POC Urinalysis Dipstick OB   Collection Time: 04/16/18 11:05 AM  Result Value Ref Range   Color, UA     Clarity, UA     Glucose, UA Negative Negative   Bilirubin, UA     Ketones, UA neg    Spec Grav, UA     Blood, UA neg    pH, UA     POC Protein UA Negative  Negative, Trace   Urobilinogen, UA     Nitrite, UA neg    Leukocytes, UA Negative Negative   Appearance     Odor      Assessment & Plan:  1) High-risk pregnancy G1P0000 at [redacted]w[redacted]d with an Estimated Date of Delivery: 05/08/18   2) CHTN, unstable  DIscussed w/LHE,IOL at 37 weeks 3) A2DM, stable; only 1 elevated pp at 142  treatment Plan:  IOL midnight tonight, orders in  Follow-up:Friday for  NST/HROB Return in about 17 days (around 05/03/2018).     Orders Placed This Encounter  Procedures  . POC Urinalysis Dipstick OB   [name] CNM 04/16/2018 11:42 AM

## 2018-04-16 NOTE — Progress Notes (Signed)
US 36+6 wks,cephalic,fhr 132 bpm,anterior pl gr 2,BPP 8/8,AFI 6.2 cm,RI .70,.60,.60=81%

## 2018-04-16 NOTE — Progress Notes (Signed)
   Induction Assessment Scheduling Form: Fax to [facility]:  [phone]  Concha NorwayCarina A Logsdon                                                                                   DOB:  02-15-1986                                                            MRN:  [id number]                                                                     Phone #:   [phone]                         Provider:  [facility]  GP:  G1P0000                                                            Estimated Date of Delivery: 05/08/18  Dating Criteria: US    Medical Indications for induction: CHTN, worsening Admission Date/Time:  Midnight 9/18 Gestational age on admission:  3437   Filed Weights   04/16/18 1100  Weight: 240 lb (108.9 kg)   HIV:  Non Reactive (07/10 0844) GBS:  +     Method of induction(proposed):  cytotec   Scheduling Provider Signature:  Jacklyn ShellFrances Cresenzo-Dishmon, CNM                                            Today's Date:  04/16/2018

## 2018-04-17 ENCOUNTER — Inpatient Hospital Stay (HOSPITAL_BASED_OUTPATIENT_CLINIC_OR_DEPARTMENT_OTHER): Payer: BLUE CROSS/BLUE SHIELD

## 2018-04-17 ENCOUNTER — Inpatient Hospital Stay (HOSPITAL_COMMUNITY)
Admit: 2018-04-17 | Discharge: 2018-04-20 | DRG: 787 | Disposition: A | Discharge status: Home / Self Care | Payer: BLUE CROSS/BLUE SHIELD | Attending: Obstetrics and Gynecology | Admitting: Obstetrics and Gynecology

## 2018-04-17 ENCOUNTER — Inpatient Hospital Stay (HOSPITAL_COMMUNITY): Payer: BLUE CROSS/BLUE SHIELD | Admitting: Anesthesiology

## 2018-04-17 ENCOUNTER — Other Ambulatory Visit: Payer: Self-pay

## 2018-04-17 ENCOUNTER — Encounter (HOSPITAL_COMMUNITY): Payer: Self-pay

## 2018-04-17 ENCOUNTER — Encounter (HOSPITAL_COMMUNITY)
Disposition: A | Discharge status: Home / Self Care | Payer: Self-pay | Source: Home / Self Care | Attending: Obstetrics and Gynecology

## 2018-04-17 DIAGNOSIS — F418 Other specified anxiety disorders: Secondary | ICD-10-CM | POA: Diagnosis present

## 2018-04-17 DIAGNOSIS — Z3A37 37 weeks gestation of pregnancy: Secondary | ICD-10-CM

## 2018-04-17 DIAGNOSIS — O9962 Diseases of the digestive system complicating childbirth: Secondary | ICD-10-CM | POA: Diagnosis present

## 2018-04-17 DIAGNOSIS — O1002 Pre-existing essential hypertension complicating childbirth: Secondary | ICD-10-CM | POA: Diagnosis present

## 2018-04-17 DIAGNOSIS — O10013 Pre-existing essential hypertension complicating pregnancy, third trimester: Secondary | ICD-10-CM | POA: Diagnosis not present

## 2018-04-17 DIAGNOSIS — O99824 Streptococcus B carrier state complicating childbirth: Secondary | ICD-10-CM | POA: Diagnosis present

## 2018-04-17 DIAGNOSIS — I10 Essential (primary) hypertension: Secondary | ICD-10-CM | POA: Diagnosis present

## 2018-04-17 DIAGNOSIS — O24429 Gestational diabetes mellitus in childbirth, unspecified control: Secondary | ICD-10-CM | POA: Diagnosis not present

## 2018-04-17 DIAGNOSIS — N871 Moderate cervical dysplasia: Secondary | ICD-10-CM | POA: Diagnosis present

## 2018-04-17 DIAGNOSIS — R109 Unspecified abdominal pain: Secondary | ICD-10-CM

## 2018-04-17 DIAGNOSIS — O24425 Gestational diabetes mellitus in childbirth, controlled by oral hypoglycemic drugs: Secondary | ICD-10-CM | POA: Diagnosis present

## 2018-04-17 DIAGNOSIS — O10919 Unspecified pre-existing hypertension complicating pregnancy, unspecified trimester: Secondary | ICD-10-CM | POA: Diagnosis present

## 2018-04-17 DIAGNOSIS — K219 Gastro-esophageal reflux disease without esophagitis: Secondary | ICD-10-CM | POA: Diagnosis present

## 2018-04-17 DIAGNOSIS — O26899 Other specified pregnancy related conditions, unspecified trimester: Secondary | ICD-10-CM

## 2018-04-17 DIAGNOSIS — O99214 Obesity complicating childbirth: Secondary | ICD-10-CM | POA: Diagnosis present

## 2018-04-17 DIAGNOSIS — Z98891 History of uterine scar from previous surgery: Secondary | ICD-10-CM

## 2018-04-17 DIAGNOSIS — Z8632 Personal history of gestational diabetes: Secondary | ICD-10-CM

## 2018-04-17 DIAGNOSIS — O99210 Obesity complicating pregnancy, unspecified trimester: Secondary | ICD-10-CM

## 2018-04-17 DIAGNOSIS — O24419 Gestational diabetes mellitus in pregnancy, unspecified control: Secondary | ICD-10-CM | POA: Diagnosis not present

## 2018-04-17 DIAGNOSIS — Z9889 Other specified postprocedural states: Secondary | ICD-10-CM

## 2018-04-17 DIAGNOSIS — O0990 Supervision of high risk pregnancy, unspecified, unspecified trimester: Secondary | ICD-10-CM

## 2018-04-17 LAB — CBC
HCT: 33.8 % — ABNORMAL LOW (ref 36.0–46.0)
HCT: 33 % — ABNORMAL LOW (ref 36.0–46.0)
HEMOGLOBIN: 11.2 g/dL — AB (ref 12.0–15.0)
HEMOGLOBIN: 10.9 g/dL — AB (ref 12.0–15.0)
MCH: 29.1 pg (ref 26.0–34.0)
MCH: 29.5 pg (ref 26.0–34.0)
MCHC: 33.1 g/dL (ref 30.0–36.0)
MCHC: 33 g/dL (ref 30.0–36.0)
MCV: 87.8 fL (ref 78.0–100.0)
MCV: 89.2 fL (ref 78.0–100.0)
PLATELETS: 253 10*3/uL (ref 150–400)
Platelets: 263 10*3/uL (ref 150–400)
RBC: 3.85 MIL/uL — AB (ref 3.87–5.11)
RBC: 3.7 MIL/uL — AB (ref 3.87–5.11)
RDW: 15.3 % (ref 11.5–15.5)
RDW: 15.3 % (ref 11.5–15.5)
WBC: 10.7 10*3/uL — ABNORMAL HIGH (ref 4.0–10.5)
WBC: 16 10*3/uL — AB (ref 4.0–10.5)

## 2018-04-17 LAB — COMPREHENSIVE METABOLIC PANEL
ALT: 14 U/L (ref 0–44)
AST: 20 U/L (ref 15–41)
Albumin: 2.7 g/dL — ABNORMAL LOW (ref 3.5–5.0)
Alkaline Phosphatase: 99 U/L (ref 38–126)
Anion gap: 7 (ref 5–15)
BUN: 10 mg/dL (ref 6–20)
CO2: 18 mmol/L — ABNORMAL LOW (ref 22–32)
Calcium: 8.7 mg/dL — ABNORMAL LOW (ref 8.9–10.3)
Chloride: 106 mmol/L (ref 98–111)
Creatinine, Ser: 0.87 mg/dL (ref 0.44–1.00)
GFR calc Af Amer: >60 mL/min (ref >60)
GFR calc non Af Amer: >60 mL/min (ref >60)
Glucose, Bld: 77 mg/dL (ref 70–99)
Potassium: 4.3 mmol/L (ref 3.5–5.1)
Sodium: 131 mmol/L — ABNORMAL LOW (ref 135–145)
Total Bilirubin: 0.4 mg/dL (ref 0.3–1.2)
Total Protein: 5.6 g/dL — ABNORMAL LOW (ref 6.5–8.1)

## 2018-04-17 LAB — TYPE AND SCREEN
ABO/RH(D): O POS
Antibody Screen: NEGATIVE

## 2018-04-17 LAB — GLUCOSE, CAPILLARY
Glucose-Capillary: 73 mg/dL (ref 70–99)
Glucose-Capillary: 70 mg/dL (ref 70–99)
Glucose-Capillary: 77 mg/dL (ref 70–99)
Glucose-Capillary: 66 mg/dL — ABNORMAL LOW (ref 70–99)
Glucose-Capillary: 56 mg/dL — ABNORMAL LOW (ref 70–99)

## 2018-04-17 LAB — PROTEIN / CREATININE RATIO, URINE: CREATININE, URINE: 49 mg/dL

## 2018-04-17 LAB — RPR: RPR Ser Ql: NONREACTIVE

## 2018-04-17 SURGERY — Surgical Case
Anesthesia: Epidural | Site: Abdomen | Wound class: Clean Contaminated

## 2018-04-17 MED ORDER — OXYTOCIN 10 UNIT/ML IJ SOLN
INTRAMUSCULAR | Status: AC
  Filled 2018-04-17: qty 4

## 2018-04-17 MED ORDER — WITCH HAZEL-GLYCERIN EX PADS: 1.0000 "application " | MEDICATED_PAD | CUTANEOUS | Status: DC | PRN

## 2018-04-17 MED ORDER — SODIUM CHLORIDE 0.9 % IV SOLN
5.0000 10*6.[IU] | Freq: Once | INTRAVENOUS | Status: AC
  Administered 2018-04-17: 5 10*6.[IU] via INTRAVENOUS
  Filled 2018-04-17: qty 5

## 2018-04-17 MED ORDER — SIMETHICONE 80 MG PO CHEW
80.0000 mg | CHEWABLE_TABLET | Freq: Three times a day (TID) | ORAL | Status: DC
  Administered 2018-04-18 – 2018-04-20 (×7): 80 mg via ORAL
  Filled 2018-04-17 (×7): qty 1

## 2018-04-17 MED ORDER — MORPHINE SULFATE-NACL 0.5-0.9 MG/ML-% IV SOSY: PREFILLED_SYRINGE | INTRAVENOUS | Status: DC | PRN

## 2018-04-17 MED ORDER — FLEET ENEMA 7-19 GM/118ML RE ENEM: 1.0000 | ENEMA | RECTAL | Status: DC | PRN

## 2018-04-17 MED ORDER — LACTATED RINGERS IV SOLN
500.0000 mL | Freq: Once | INTRAVENOUS | Status: AC
  Administered 2018-04-17: 500 mL via INTRAVENOUS

## 2018-04-17 MED ORDER — PHENYLEPHRINE HCL 10 MG/ML IJ SOLN: INTRAMUSCULAR | Status: DC | PRN

## 2018-04-17 MED ORDER — ENOXAPARIN SODIUM 60 MG/0.6ML ~~LOC~~ SOLN
50.0000 mg | SUBCUTANEOUS | Status: DC
  Administered 2018-04-18 – 2018-04-19 (×2): 50 mg via SUBCUTANEOUS
  Filled 2018-04-17 (×3): qty 0.6

## 2018-04-17 MED ORDER — DIPHENHYDRAMINE HCL 50 MG/ML IJ SOLN: 12.5000 mg | INTRAMUSCULAR | Status: DC | PRN

## 2018-04-17 MED ORDER — ONDANSETRON HCL 4 MG/2ML IJ SOLN
INTRAMUSCULAR | Status: AC
  Filled 2018-04-17: qty 2

## 2018-04-17 MED ORDER — LABETALOL HCL 5 MG/ML IV SOLN: 80.0000 mg | INTRAVENOUS | Status: DC | PRN

## 2018-04-17 MED ORDER — EPHEDRINE 5 MG/ML INJ: 10.0000 mg | INTRAVENOUS | Status: DC | PRN

## 2018-04-17 MED ORDER — LACTATED RINGERS IV SOLN
500.0000 mL | INTRAVENOUS | Status: DC | PRN
  Administered 2018-04-17 (×2): 500 mL via INTRAVENOUS

## 2018-04-17 MED ORDER — TETANUS-DIPHTH-ACELL PERTUSSIS 5-2.5-18.5 LF-MCG/0.5 IM SUSP: 0.5000 mL | Freq: Once | INTRAMUSCULAR | Status: DC

## 2018-04-17 MED ORDER — DEXTROSE 50 % IV SOLN
25.0000 mL | Freq: Once | INTRAVENOUS | Status: AC
  Administered 2018-04-17: 25 mL via INTRAVENOUS

## 2018-04-17 MED ORDER — OXYTOCIN 10 UNIT/ML IJ SOLN
INTRAVENOUS | Status: DC | PRN
  Administered 2018-04-17: 40 [IU] via INTRAVENOUS

## 2018-04-17 MED ORDER — DIPHENHYDRAMINE HCL 25 MG PO CAPS: 25.0000 mg | ORAL_CAPSULE | Freq: Four times a day (QID) | ORAL | Status: DC | PRN

## 2018-04-17 MED ORDER — LABETALOL HCL 200 MG PO TABS
200.0000 mg | ORAL_TABLET | Freq: Three times a day (TID) | ORAL | Status: DC
  Administered 2018-04-17 (×2): 200 mg via ORAL
  Filled 2018-04-17 (×2): qty 1

## 2018-04-17 MED ORDER — DEXTROSE 5 % IV SOLN
INTRAVENOUS | Status: AC
  Filled 2018-04-17: qty 3000

## 2018-04-17 MED ORDER — LACTATED RINGERS IV SOLN: 500.0000 mL | Freq: Once | INTRAVENOUS | Status: DC

## 2018-04-17 MED ORDER — OXYTOCIN 40 UNITS IN LACTATED RINGERS INFUSION - SIMPLE MED: 2.5000 [IU]/h | INTRAVENOUS | Status: AC

## 2018-04-17 MED ORDER — MEPERIDINE HCL 25 MG/ML IJ SOLN: 6.2500 mg | INTRAMUSCULAR | Status: DC | PRN

## 2018-04-17 MED ORDER — LACTATED RINGERS AMNIOINFUSION
INTRAVENOUS | Status: DC
  Administered 2018-04-17: 300 mL via INTRAUTERINE

## 2018-04-17 MED ORDER — DIBUCAINE 1 % RE OINT: 1.0000 "application " | TOPICAL_OINTMENT | RECTAL | Status: DC | PRN

## 2018-04-17 MED ORDER — LABETALOL HCL 5 MG/ML IV SOLN: 20.0000 mg | INTRAVENOUS | Status: DC | PRN

## 2018-04-17 MED ORDER — OXYTOCIN BOLUS FROM INFUSION: 500.0000 mL | Freq: Once | INTRAVENOUS | Status: DC

## 2018-04-17 MED ORDER — ONDANSETRON HCL 4 MG/2ML IJ SOLN
4.0000 mg | Freq: Three times a day (TID) | INTRAMUSCULAR | Status: DC | PRN
  Administered 2018-04-17: 4 mg via INTRAVENOUS
  Filled 2018-04-17: qty 2

## 2018-04-17 MED ORDER — LIDOCAINE HCL (PF) 1 % IJ SOLN
INTRAMUSCULAR | Status: DC | PRN
  Administered 2018-04-17 (×2): 5 mL via EPIDURAL

## 2018-04-17 MED ORDER — LABETALOL HCL 5 MG/ML IV SOLN: 40.0000 mg | INTRAVENOUS | Status: DC | PRN

## 2018-04-17 MED ORDER — NALBUPHINE HCL 10 MG/ML IJ SOLN: 5.0000 mg | Freq: Once | INTRAMUSCULAR | Status: DC | PRN

## 2018-04-17 MED ORDER — PHENYLEPHRINE 40 MCG/ML (10ML) SYRINGE FOR IV PUSH (FOR BLOOD PRESSURE SUPPORT)
80.0000 ug | PREFILLED_SYRINGE | INTRAVENOUS | Status: AC | PRN
  Administered 2018-04-17 (×3): 80 ug via INTRAVENOUS

## 2018-04-17 MED ORDER — DEXTROSE 5 % IV SOLN: INTRAVENOUS | Status: DC | PRN

## 2018-04-17 MED ORDER — TERBUTALINE SULFATE 1 MG/ML IJ SOLN
INTRAMUSCULAR | Status: AC
  Administered 2018-04-17: 0.25 mg via SUBCUTANEOUS
  Filled 2018-04-17: qty 1

## 2018-04-17 MED ORDER — OXYTOCIN 40 UNITS IN LACTATED RINGERS INFUSION - SIMPLE MED: 2.5000 [IU]/h | INTRAVENOUS | Status: DC

## 2018-04-17 MED ORDER — DIPHENHYDRAMINE HCL 25 MG PO CAPS: 25.0000 mg | ORAL_CAPSULE | ORAL | Status: DC | PRN

## 2018-04-17 MED ORDER — PENICILLIN G 3 MILLION UNITS IVPB - SIMPLE MED
3.0000 10*6.[IU] | INTRAVENOUS | Status: DC
  Administered 2018-04-17 (×3): 3 10*6.[IU] via INTRAVENOUS
  Filled 2018-04-17 (×3): qty 100

## 2018-04-17 MED ORDER — SIMETHICONE 80 MG PO CHEW: 80.0000 mg | CHEWABLE_TABLET | ORAL | Status: DC | PRN

## 2018-04-17 MED ORDER — LIDOCAINE HCL (PF) 1 % IJ SOLN: 30.0000 mL | INTRAMUSCULAR | Status: DC | PRN

## 2018-04-17 MED ORDER — HYDRALAZINE HCL 20 MG/ML IJ SOLN: 10.0000 mg | INTRAMUSCULAR | Status: DC | PRN

## 2018-04-17 MED ORDER — COCONUT OIL OIL: 1.0000 "application " | TOPICAL_OIL | Status: DC | PRN

## 2018-04-17 MED ORDER — PHENYLEPHRINE 40 MCG/ML (10ML) SYRINGE FOR IV PUSH (FOR BLOOD PRESSURE SUPPORT): 80.0000 ug | PREFILLED_SYRINGE | INTRAVENOUS | Status: DC | PRN

## 2018-04-17 MED ORDER — SOD CITRATE-CITRIC ACID 500-334 MG/5ML PO SOLN
30.0000 mL | ORAL | Status: DC | PRN
  Filled 2018-04-17: qty 15

## 2018-04-17 MED ORDER — ZOLPIDEM TARTRATE 5 MG PO TABS: 5.0000 mg | ORAL_TABLET | Freq: Every evening | ORAL | Status: DC | PRN

## 2018-04-17 MED ORDER — KETOROLAC TROMETHAMINE 30 MG/ML IJ SOLN: 30.0000 mg | Freq: Four times a day (QID) | INTRAMUSCULAR | Status: AC | PRN

## 2018-04-17 MED ORDER — SODIUM CHLORIDE 0.9% FLUSH: 3.0000 mL | INTRAVENOUS | Status: DC | PRN

## 2018-04-17 MED ORDER — MORPHINE SULFATE (PF) 0.5 MG/ML IJ SOLN
INTRAMUSCULAR | Status: AC
  Filled 2018-04-17: qty 10

## 2018-04-17 MED ORDER — FENTANYL CITRATE (PF) 100 MCG/2ML IJ SOLN
50.0000 ug | INTRAMUSCULAR | Status: DC | PRN
  Administered 2018-04-17 (×2): 100 ug via INTRAVENOUS
  Filled 2018-04-17 (×2): qty 2

## 2018-04-17 MED ORDER — MISOPROSTOL 50MCG HALF TABLET
50.0000 ug | ORAL_TABLET | ORAL | Status: DC
  Administered 2018-04-17 (×2): 50 ug via ORAL
  Filled 2018-04-17 (×2): qty 1

## 2018-04-17 MED ORDER — MORPHINE SULFATE (PF) 0.5 MG/ML IJ SOLN
INTRAMUSCULAR | Status: DC | PRN
  Administered 2018-04-17: 3 mg via EPIDURAL
  Administered 2018-04-17: 2 mg via INTRAVENOUS

## 2018-04-17 MED ORDER — FENTANYL 2.5 MCG/ML BUPIVACAINE 1/10 % EPIDURAL INFUSION (WH - ANES)
14.0000 mL/h | INTRAMUSCULAR | Status: DC | PRN
  Administered 2018-04-17: 12 mL/h via EPIDURAL
  Administered 2018-04-17: 14 mL/h via EPIDURAL
  Filled 2018-04-17: qty 100

## 2018-04-17 MED ORDER — HYDROXYZINE HCL 50 MG PO TABS: 50.0000 mg | ORAL_TABLET | Freq: Four times a day (QID) | ORAL | Status: DC | PRN

## 2018-04-17 MED ORDER — SODIUM CHLORIDE 0.9 % IV SOLN
500.0000 mg | Freq: Once | INTRAVENOUS | Status: AC
  Administered 2018-04-17: 500 mg via INTRAVENOUS
  Filled 2018-04-17: qty 500

## 2018-04-17 MED ORDER — NALOXONE HCL 0.4 MG/ML IJ SOLN: 0.4000 mg | INTRAMUSCULAR | Status: DC | PRN

## 2018-04-17 MED ORDER — NIFEDIPINE ER OSMOTIC RELEASE 30 MG PO TB24
30.0000 mg | ORAL_TABLET | Freq: Every day | ORAL | Status: DC
  Administered 2018-04-18 – 2018-04-20 (×3): 30 mg via ORAL
  Filled 2018-04-17 (×3): qty 1

## 2018-04-17 MED ORDER — NALBUPHINE HCL 10 MG/ML IJ SOLN: 5.0000 mg | INTRAMUSCULAR | Status: DC | PRN

## 2018-04-17 MED ORDER — LIDOCAINE-EPINEPHRINE (PF) 2 %-1:200000 IJ SOLN
INTRAMUSCULAR | Status: AC
  Filled 2018-04-17: qty 20

## 2018-04-17 MED ORDER — ONDANSETRON HCL 4 MG/2ML IJ SOLN
INTRAMUSCULAR | Status: DC | PRN
  Administered 2018-04-17: 4 mg via INTRAVENOUS

## 2018-04-17 MED ORDER — FENTANYL CITRATE (PF) 100 MCG/2ML IJ SOLN
25.0000 ug | INTRAMUSCULAR | Status: DC | PRN
  Administered 2018-04-17: 50 ug via INTRAVENOUS

## 2018-04-17 MED ORDER — DEXTROSE 50 % IV SOLN
INTRAVENOUS | Status: AC
  Filled 2018-04-17: qty 50

## 2018-04-17 MED ORDER — IBUPROFEN 600 MG PO TABS
600.0000 mg | ORAL_TABLET | Freq: Four times a day (QID) | ORAL | Status: DC
  Administered 2018-04-17 – 2018-04-20 (×10): 600 mg via ORAL
  Filled 2018-04-17 (×10): qty 1

## 2018-04-17 MED ORDER — PRENATAL MULTIVITAMIN CH
1.0000 | ORAL_TABLET | Freq: Every day | ORAL | Status: DC
  Administered 2018-04-18 – 2018-04-19 (×2): 1 via ORAL
  Filled 2018-04-17 (×3): qty 1

## 2018-04-17 MED ORDER — LABETALOL HCL 200 MG PO TABS
200.0000 mg | ORAL_TABLET | Freq: Three times a day (TID) | ORAL | Status: DC
  Administered 2018-04-17: 200 mg via ORAL
  Filled 2018-04-17: qty 1

## 2018-04-17 MED ORDER — MENTHOL 3 MG MT LOZG: 1.0000 | LOZENGE | OROMUCOSAL | Status: DC | PRN

## 2018-04-17 MED ORDER — LACTATED RINGERS IV BOLUS
1000.0000 mL | Freq: Once | INTRAVENOUS | Status: AC
  Administered 2018-04-17: 1000 mL via INTRAVENOUS

## 2018-04-17 MED ORDER — NALOXONE HCL 4 MG/10ML IJ SOLN: 1.0000 ug/kg/h | INTRAVENOUS | Status: DC | PRN

## 2018-04-17 MED ORDER — PHENYLEPHRINE 40 MCG/ML (10ML) SYRINGE FOR IV PUSH (FOR BLOOD PRESSURE SUPPORT)
PREFILLED_SYRINGE | INTRAVENOUS | Status: AC
  Filled 2018-04-17: qty 10

## 2018-04-17 MED ORDER — OXYCODONE-ACETAMINOPHEN 5-325 MG PO TABS: 2.0000 | ORAL_TABLET | ORAL | Status: DC | PRN

## 2018-04-17 MED ORDER — FENTANYL CITRATE (PF) 100 MCG/2ML IJ SOLN
INTRAMUSCULAR | Status: AC
  Filled 2018-04-17: qty 2

## 2018-04-17 MED ORDER — SODIUM CHLORIDE 0.9 % IR SOLN
Status: DC | PRN
  Administered 2018-04-17: 1

## 2018-04-17 MED ORDER — ONDANSETRON HCL 4 MG/2ML IJ SOLN
4.0000 mg | Freq: Four times a day (QID) | INTRAMUSCULAR | Status: DC | PRN
  Administered 2018-04-17: 4 mg via INTRAVENOUS
  Filled 2018-04-17: qty 2

## 2018-04-17 MED ORDER — OXYCODONE HCL 5 MG PO TABS
5.0000 mg | ORAL_TABLET | ORAL | Status: DC | PRN
  Administered 2018-04-18: 5 mg via ORAL
  Filled 2018-04-17 (×2): qty 1

## 2018-04-17 MED ORDER — FENTANYL 2.5 MCG/ML BUPIVACAINE 1/10 % EPIDURAL INFUSION (WH - ANES)
INTRAMUSCULAR | Status: AC
  Filled 2018-04-17: qty 100

## 2018-04-17 MED ORDER — PHENYLEPHRINE 40 MCG/ML (10ML) SYRINGE FOR IV PUSH (FOR BLOOD PRESSURE SUPPORT)
PREFILLED_SYRINGE | INTRAVENOUS | Status: AC
  Filled 2018-04-17: qty 20

## 2018-04-17 MED ORDER — CEFAZOLIN SODIUM-DEXTROSE 2-3 GM-%(50ML) IV SOLR
INTRAVENOUS | Status: DC | PRN
  Administered 2018-04-17: 2 g via INTRAVENOUS

## 2018-04-17 MED ORDER — ACETAMINOPHEN 325 MG PO TABS
650.0000 mg | ORAL_TABLET | ORAL | Status: DC | PRN
  Administered 2018-04-18 (×2): 650 mg via ORAL
  Filled 2018-04-17 (×2): qty 2

## 2018-04-17 MED ORDER — SODIUM BICARBONATE 8.4 % IV SOLN
INTRAVENOUS | Status: AC
  Filled 2018-04-17: qty 50

## 2018-04-17 MED ORDER — ACETAMINOPHEN 325 MG PO TABS: 650.0000 mg | ORAL_TABLET | ORAL | Status: DC | PRN

## 2018-04-17 MED ORDER — MEASLES, MUMPS & RUBELLA VAC ~~LOC~~ INJ: 0.5000 mL | INJECTION | Freq: Once | SUBCUTANEOUS | Status: DC

## 2018-04-17 MED ORDER — OXYCODONE-ACETAMINOPHEN 5-325 MG PO TABS: 1.0000 | ORAL_TABLET | ORAL | Status: DC | PRN

## 2018-04-17 MED ORDER — SENNOSIDES-DOCUSATE SODIUM 8.6-50 MG PO TABS
2.0000 | ORAL_TABLET | ORAL | Status: DC
  Administered 2018-04-17 – 2018-04-18 (×2): 2 via ORAL
  Filled 2018-04-17 (×3): qty 2

## 2018-04-17 MED ORDER — LACTATED RINGERS IV SOLN
INTRAVENOUS | Status: DC
  Administered 2018-04-18 (×2): via INTRAVENOUS

## 2018-04-17 MED ORDER — OXYCODONE HCL 5 MG PO TABS
10.0000 mg | ORAL_TABLET | ORAL | Status: DC | PRN
  Administered 2018-04-18: 10 mg via ORAL
  Filled 2018-04-17: qty 2

## 2018-04-17 MED ORDER — SODIUM BICARBONATE 8.4 % IV SOLN
INTRAVENOUS | Status: DC | PRN
  Administered 2018-04-17: 5 mL via EPIDURAL
  Administered 2018-04-17: 3 mL via EPIDURAL

## 2018-04-17 MED ORDER — SIMETHICONE 80 MG PO CHEW
80.0000 mg | CHEWABLE_TABLET | ORAL | Status: DC
  Administered 2018-04-17 – 2018-04-19 (×3): 80 mg via ORAL
  Filled 2018-04-17 (×3): qty 1

## 2018-04-17 MED ORDER — LACTATED RINGERS IV SOLN
INTRAVENOUS | Status: DC
  Administered 2018-04-17 (×4): via INTRAVENOUS

## 2018-04-17 MED ORDER — TERBUTALINE SULFATE 1 MG/ML IJ SOLN
INTRAMUSCULAR | Status: AC
  Administered 2018-04-17: 1 mg
  Filled 2018-04-17: qty 1

## 2018-04-17 SURGICAL SUPPLY — 43 items
BENZOIN TINCTURE PRP APPL 2/3 (GAUZE/BANDAGES/DRESSINGS) ×3 IMPLANT
CHLORAPREP W/TINT 26ML (MISCELLANEOUS) ×3 IMPLANT
CLAMP CORD UMBIL (MISCELLANEOUS) IMPLANT
CLOSURE STERI STRIP 1/2 X4 (GAUZE/BANDAGES/DRESSINGS) ×3 IMPLANT
CLOSURE WOUND 1/2 X4 (GAUZE/BANDAGES/DRESSINGS)
CLOTH BEACON ORANGE TIMEOUT ST (SAFETY) ×3 IMPLANT
DRSG OPSITE POSTOP 4X10 (GAUZE/BANDAGES/DRESSINGS) ×3 IMPLANT
ELECT REM PT RETURN 9FT ADLT (ELECTROSURGICAL) ×3
ELECTRODE REM PT RTRN 9FT ADLT (ELECTROSURGICAL) ×1 IMPLANT
EXTRACTOR VACUUM KIWI (MISCELLANEOUS) IMPLANT
GAUZE SPONGE 4X4 12PLY STRL LF (GAUZE/BANDAGES/DRESSINGS) ×6 IMPLANT
GLOVE BIOGEL PI IND STRL 7.0 (GLOVE) ×1 IMPLANT
GLOVE BIOGEL PI IND STRL 9 (GLOVE) ×1 IMPLANT
GLOVE BIOGEL PI INDICATOR 7.0 (GLOVE) ×2
GLOVE BIOGEL PI INDICATOR 9 (GLOVE) ×2
GLOVE SS PI 9.0 STRL (GLOVE) ×3 IMPLANT
GOWN STRL REUS W/TWL 2XL LVL3 (GOWN DISPOSABLE) ×3 IMPLANT
GOWN STRL REUS W/TWL LRG LVL3 (GOWN DISPOSABLE) ×3 IMPLANT
HOVERMATT SINGLE USE (MISCELLANEOUS) ×3 IMPLANT
KIT ABG SYR 3ML LUER SLIP (SYRINGE) ×3 IMPLANT
NEEDLE HYPO 25X5/8 SAFETYGLIDE (NEEDLE) ×3 IMPLANT
NS IRRIG 1000ML POUR BTL (IV SOLUTION) ×3 IMPLANT
PACK C SECTION WH (CUSTOM PROCEDURE TRAY) ×3 IMPLANT
PAD ABD 7.5X8 STRL (GAUZE/BANDAGES/DRESSINGS) ×3 IMPLANT
PAD OB MATERNITY 4.3X12.25 (PERSONAL CARE ITEMS) ×3 IMPLANT
PENCIL SMOKE EVAC W/HOLSTER (ELECTROSURGICAL) ×3 IMPLANT
RETRACTOR TRAXI PANNICULUS (MISCELLANEOUS) ×1 IMPLANT
RTRCTR C-SECT PINK 25CM LRG (MISCELLANEOUS) IMPLANT
RTRCTR C-SECT PINK 34CM XLRG (MISCELLANEOUS) IMPLANT
SPONGE LAP 18X18 RF (DISPOSABLE) ×21 IMPLANT
STRIP CLOSURE SKIN 1/2X4 (GAUZE/BANDAGES/DRESSINGS) IMPLANT
SUT MNCRL 0 VIOLET CTX 36 (SUTURE) ×3 IMPLANT
SUT MONOCRYL 0 CTX 36 (SUTURE) ×6
SUT PLAIN 2 0 XLH (SUTURE) ×3 IMPLANT
SUT VIC AB 0 CT1 27 (SUTURE) ×2
SUT VIC AB 0 CT1 27XBRD ANBCTR (SUTURE) ×1 IMPLANT
SUT VIC AB 2-0 CT1 27 (SUTURE) ×2
SUT VIC AB 2-0 CT1 TAPERPNT 27 (SUTURE) ×1 IMPLANT
SUT VIC AB 4-0 KS 27 (SUTURE) ×3 IMPLANT
SYR BULB IRRIGATION 50ML (SYRINGE) IMPLANT
TOWEL OR 17X24 6PK STRL BLUE (TOWEL DISPOSABLE) ×3 IMPLANT
TRAXI PANNICULUS RETRACTOR (MISCELLANEOUS) ×2
TRAY FOLEY W/BAG SLVR 14FR LF (SET/KITS/TRAYS/PACK) ×3 IMPLANT

## 2018-04-17 NOTE — Progress Notes (Signed)
Called by RN to review fetal strip after AROM. Pt is s/p foley bulb and id not currently on pitocin. Has FSE in place.   130/min with episodes of moderate/ no accels/ variable decelerations that nadir at 90 and return to baseline within 60 seconds.   Contractions: every 2-3 minutes   Overall reassuring fetal status. This currently a Category II strip. Given normal baseline,  categorizing as a minimal variability with variable decelerations that are moderate the current risk of fetal acidemia is acceptably low and risk of evolution is high (Parer et al 2010).  We are prepared for a possible urgent delivery and will continue with amnioinfusion, position changes and labor as patient is contracting spontaneously.   Federico FlakeKimberly Niles Zakyla Tonche, MD, MPH, ABFM Attending Physician Faculty Practice- [facility]

## 2018-04-17 NOTE — Anesthesia Procedure Notes (Signed)
Epidural Patient location during procedure: OB  Staffing Anesthesiologist: Phillips Groutarignan, Briant Angelillo, MD Performed: anesthesiologist   Preanesthetic Checklist Completed: patient identified, site marked, surgical consent, pre-op evaluation, timeout performed, IV checked, risks and benefits discussed and monitors and equipment checked  Epidural Patient position: sitting Prep: DuraPrep Patient monitoring: heart rate, continuous pulse ox and blood pressure Approach: right paramedian Location: L3-L4 Injection technique: LOR saline  Needle:  Needle type: Tuohy  Needle gauge: 17 G Needle length: 9 cm and 9 Needle insertion depth: 9 cm Catheter type: closed end flexible Catheter size: 20 Guage Catheter at skin depth: 14 cm Test dose: negative  Assessment Events: blood not aspirated, injection not painful, no injection resistance, negative IV test and no paresthesia  Additional Notes Patient identified. Risks/Benefits/Options discussed with patient including but not limited to bleeding, infection, nerve damage, paralysis, failed block, incomplete pain control, headache, blood pressure changes, nausea, vomiting, reactions to medication both or allergic, itching and postpartum back pain. Confirmed with bedside nurse the patient's most recent platelet count. Confirmed with patient that they are not currently taking any anticoagulation, have any bleeding history or any family history of bleeding disorders. Patient expressed understanding and wished to proceed. All questions were answered. Sterile technique was used throughout the entire procedure. Please see nursing notes for vital signs. Test dose was given through epidural needle and negative prior to continuing to dose epidural or start infusion. Warning signs of high block given to the patient including shortness of breath, tingling/numbness in hands, complete motor block, or any concerning symptoms with instructions to call for help. Patient was given  instructions on fall risk and not to get out of bed. All questions and concerns addressed with instructions to call with any issues.

## 2018-04-17 NOTE — Progress Notes (Signed)
Patient ID: Kerry Ryan, female   DOB: 05-18-1986, 32 y.o.   MRN: [id number] Late decelerations have now resolved  Vitals:   04/17/18 0105 04/17/18 0107 04/17/18 0212 04/17/18 0319  BP:  (!) 143/106 (!) 156/91 (!) 145/85  Pulse:  75 78 84  Resp: 18  18   Temp: 98.2 F (36.8 C)     TempSrc: Oral     Weight: 108.2 kg     Height: 4\' 10"  (1.473 m)      FHR stable, nonreactive but no decels

## 2018-04-17 NOTE — Progress Notes (Signed)
Kerry Ryan is a 32 y.o. G1P0000 at 8830w0d  admitted for induction of labor due to Gestational diabetes and Hypertension.  Subjective:  Pt has had a run of late decels in to 60's, treated with terbutalene,  Exam by Dr Kerry Ryan: as below.[name].  Baby has responded with improved fhr after terbutaline. Will proceed to cesarean, risks benefits, rationale reviewed Objective: BP (!) 141/71   Pulse 66   Temp 98 F (36.7 C) (Oral)   Resp 18   Ht 4\' 10"  (1.473 m)   Wt 108.2 kg   LMP 08/01/2017   SpO2 99%   BMI 49.85 kg/m  No intake/output data recorded. Total I/O In: -  Out: 1800 [Urine:1800]  FHT:  FHR: 145 bpm, variability: moderate,  accelerations:  Present,  decelerations:  Present lates , deep to 60's UC:   irregular, every 2-4 minutes SVE:   Dilation: 5 Effacement (%): 80 Station: -2 Exam by:: Dr Kerry Ryan  Labs: Lab Results  Component Value Date   WBC 10.7 (H) 04/17/2018   HGB 11.2 (L) 04/17/2018   HCT 33.8 (L) 04/17/2018   MCV 87.8 04/17/2018   PLT 263 04/17/2018    Assessment / Plan: fetal intolerance of labor  Labor: pit d/c'd Preeclampsia:   Fetal Wellbeing:  Category II Pain Control:  Epidural I/D:  n/a Anticipated MOD:  Cesarean section to be done  Tilda BurrowJohn V Dreon Ryan 04/17/2018, 5:46 PM

## 2018-04-17 NOTE — Progress Notes (Signed)
Patient set up with DEBP. Explained and demonstrated use to patient and husband. Expressed understanding. Explained cleaning of parts and frequency of pumping needed due to infant being in NICU.

## 2018-04-17 NOTE — Progress Notes (Signed)
Patient ID: Mitchell Heirarina A Selinger, female   DOB: 02-01-1986, 32 y.o.   MRN: [id number]  S: 32 y.o. is a G1P0000 at 5215w0d her for IOL for Patient was having excessive ctx due to 2nd cytotec dose and s/p terbutaline. She received an epidural at about 8 AM  O:   Vitals:   04/17/18 0850 04/17/18 0859 04/17/18 0901 04/17/18 0930  BP: 139/86 139/86 (!) 136/91 (!) 145/56  Pulse: 69  63 71  Resp: 18 16 16 18   Temp:      TempSrc:      SpO2:      Weight:      Height:        EFM: 120/minimal/No accels, shallow decels that nadir at 90 and return to baseline within 1 minute.    A/P: Labor: stopped IOL, last cytotec at 0630.  Recommended position changes and O2 to help with decels. Plan to reevaluate in 2 hours and possible FB placement at that time.

## 2018-04-17 NOTE — H&P (Signed)
HPI copied from student's note.  I have reviewed the information and agree with presentation. Aviva Signs, CNM  Brannon A Lucci is a 32 y.o. female G1P0000 with IUP at [redacted]w[redacted]d by LMP presenting for IOL for uncontrolled CHTN and GDM. Yesterday 9/17, BP was elevated 2x in office (143/103 and 150/110) and she was scheduled for induction at midnight today. She reports +FMs, No LOF, no VB. She is not having contractions.   BP has been uncontrolled during pregnancy despite repeated increases in labetalol. Currently taking 200mg  TID, last took meds yesterday evening (9/17) before coming to hospital .    Patient reports through her pregnancy, she has continued to have recurring headaches, bilateral LE edema, and sharp, pulling bilateral upper quadrant abd pain "under rib cage." In the past week, she has noted worsened non painful swelling of her hands and feet. She had floaters this AM (9/18), that has since resolved.     She plans on breast feeding. She is undecided for birth control. She received her prenatal care at [facility]    Dating: By LMP --->  Estimated Date of Delivery: 05/08/18   Sono:   @35w  and 6d, CWD, normal anatomy, cephalic presentation, 2477 g, 20% EFW     OB History    Gravida  1   Para  0   Term  0   Preterm  0   AB  0   Living  0     SAB  0   TAB  0   Ectopic  0   Multiple  0   Live Births  0          Past Medical History:  Diagnosis Date  . Acne   . Anxiety   . Chlamydia infection   . Depression   . GERD (gastroesophageal reflux disease)   . Vaginal Pap smear, abnormal    Past Surgical History:  Procedure Laterality Date  . COLPOSCOPY    . LEEP     Family History: family history includes Cancer in her paternal grandmother; Hypertension in her mother; Irritable bowel syndrome in her maternal grandmother; Kidney failure in her maternal grandfather; Other in her maternal grandfather, maternal grandmother, and paternal grandmother;  Sarcoidosis in her mother. Social History:  reports that she has never smoked. She has never used smokeless tobacco. She reports that she does not drink alcohol or use drugs.     Maternal Diabetes: Yes:  Diabetes Type:  Diet controlled with Metformin Genetic Screening: Normal Maternal Ultrasounds/Referrals: Normal Fetal Ultrasounds or other Referrals:  None Maternal Substance Abuse:  No Significant Maternal Medications: Metformin, Labetalol, ASA Significant Maternal Lab Results:  Lab values include: Group B Strep positive Other Comments:  None  Review of Systems  Constitutional: Negative for chills and fever.  Eyes: Negative for blurred vision.  Cardiovascular: Positive for leg swelling.  Gastrointestinal: Negative for abdominal pain, constipation, diarrhea, nausea and vomiting.  Neurological: Negative for dizziness and focal weakness.   Maternal Medical History:  Reason for admission: Nausea. Hypertension, GDM  Contractions: Frequency: irregular.   Perceived severity is mild.    Fetal activity: Perceived fetal activity is normal.   Last perceived fetal movement was within the past hour.    Prenatal complications: PIH.   No bleeding, infection, placental abnormality, pre-eclampsia or preterm labor.   Prenatal Complications - Diabetes: gestational. Diabetes is managed by oral agent (monotherapy) and diet.      Dilation: Fingertip Effacement (%): Thick Exam by:: Wynelle Bourgeois, CNM Blood  pressure (!) 156/91, pulse 78, temperature 98.2 F (36.8 C), temperature source Oral, resp. rate 18, height 4\' 10"  (1.473 m), weight 108.2 kg, last menstrual period 08/01/2017. Maternal Exam:  Uterine Assessment: Contraction strength is mild.  Contraction frequency is irregular and rare.   Abdomen: Patient reports no abdominal tenderness. Fetal presentation: vertex  Introitus: Normal vulva. Normal vagina.  Ferning test: not done.  Nitrazine test: not done. Amniotic fluid character: not  assessed.  Pelvis: adequate for delivery.   Cervix: Cervix evaluated by digital exam.     Fetal Exam Fetal Monitor Review: Mode: ultrasound.   Baseline rate: 140.  Variability: moderate (6-25 bpm).   Pattern: accelerations present and no decelerations.    Fetal State Assessment: Category I - tracings are normal.     Physical Exam  Constitutional: She is oriented to person, place, and time. She appears well-developed and well-nourished. No distress.  Cardiovascular: Normal rate, regular rhythm and normal heart sounds.  Respiratory: Effort normal and breath sounds normal. No respiratory distress. She has no wheezes. She has no rales.  GI: Soft. She exhibits no distension. There is no tenderness. There is no rebound and no guarding.  Genitourinary:  Genitourinary Comments: Dilation: Fingertip Effacement (%): Thick Cervical Position: Posterior Presentation: Vertex(by U/S) Exam by:: Wynelle BourgeoisMarie Rozalia Dino, CNM   Musculoskeletal: Normal range of motion.  Neurological: She is alert and oriented to person, place, and time.  Skin: Skin is warm and dry.  Psychiatric: She has a normal mood and affect.    Prenatal labs: ABO, Rh: O/Positive/-- (03/14 1602) Antibody: Negative (07/10 0844) Rubella: 1.03 (03/14 1602) RPR: Non Reactive (07/10 0844)  HBsAg: Negative (03/14 1602)  HIV: Non Reactive (07/10 0844)  GBS: Positive (03/14 0000)   Assessment/Plan: Single intrauterine pregnancy at 7113w0d Chronic Hypertension  Gestational diabetes controlled with diet and Metformin History of Depression History of Abnormal pap with LEEP Group B strep colonization/bacteruria  [facility] Routine orders Cytotec for cervical ripening Foley when able to insert CBG q 4 hrs Continue PO Labetalol Preeclampsia focused order set CMET and Pr/Cr ratio added to orders     Wynelle BourgeoisMarie Seyed Heffley 04/17/2018, 2:37 AM

## 2018-04-17 NOTE — Anesthesia Postprocedure Evaluation (Signed)
Anesthesia Post Note  Patient: Kerry Ryan  Procedure(s) Performed: CESAREAN SECTION (N/A Abdomen)     Patient location during evaluation: PACU Anesthesia Type: Epidural Level of consciousness: awake and alert Pain management: pain level controlled Vital Signs Assessment: post-procedure vital signs reviewed and stable Respiratory status: spontaneous breathing, nonlabored ventilation and respiratory function stable Cardiovascular status: stable Postop Assessment: no headache, no backache and epidural receding Anesthetic complications: no    Last Vitals:  Vitals:   04/17/18 1944 04/17/18 2000  BP: (!) 146/83 130/90  Pulse: 74 74  Resp: 19 17  Temp:  36.8 C  SpO2: 99% 98%    Last Pain:  Vitals:   04/17/18 2000  TempSrc:   PainSc: 4    Pain Goal: Patients Stated Pain Goal: 6 (04/17/18 0722)               Romie Jumperhelsey L Aleina Burgio

## 2018-04-17 NOTE — Transfer of Care (Signed)
Immediate Anesthesia Transfer of Care Note  Patient: Kerry Ryan  Procedure(s) Performed: CESAREAN SECTION (N/A Abdomen)  Patient Location: PACU  Anesthesia Type:Epidural  Level of Consciousness: awake, alert , oriented and patient cooperative  Airway & Oxygen Therapy: Patient Spontanous Breathing  Post-op Assessment: Report given to RN and Post -op Vital signs reviewed and stable  Post vital signs: Reviewed and stable  Last Vitals:  Vitals Value Taken Time  BP 136/78 04/17/2018  7:02 PM  Temp    Pulse 81 04/17/2018  7:04 PM  Resp    SpO2 100 % 04/17/2018  7:04 PM  Vitals shown include unvalidated device data.  Last Pain:  Vitals:   04/17/18 1721  TempSrc: Oral  PainSc:       Patients Stated Pain Goal: 6 (04/17/18 [id number])  Complications: No apparent anesthesia complications

## 2018-04-17 NOTE — Progress Notes (Signed)
Labor Progress Note Nadyne A Bandel is a 32 y.o. G1P0000 at 2249w0d presented for IOL 2/2 GDM, cHTN.  S: not feeling any ctx w/ epidural. Feels sleepy.   O:  BP 128/69   Pulse 74   Temp 98.2 F (36.8 C) (Oral)   Resp 16   Ht 4\' 10"  (1.473 m)   Wt 108.2 kg   LMP 08/01/2017   SpO2 100%   BMI 49.85 kg/m  EFM: 130/mod var/no acels, frequent variables decels  CVE: Dilation: 1.5 Effacement (%): 50 Cervical Position: Posterior Station: -3 Presentation: Vertex Exam by:: Dr. Raeya Merritts(Dr. Earlene PlaterWallace at bedside)   A&P: 32 y.o. G1P0000 1149w0d presented for IOL 2/2 GDM, cHTN.  #Labor: Progressing slowly. Foley bulb fell out. AROM@1415 , clear fluid. Placed IUPC w/ amnioinfusion and FSE.  #Pain: epidural #FWB: Cat 2 FHT. Having frequent decel, mostly variables now. Will tart amnioinfusion. FSE placed. #GBS positive on Pen G x3 #cHTN w/ ?superimposed PEC: on labetalol 200 TID. BPs remain elevated, not in severe range. PIH labs wnl. #gDM: BG well controlled, cont to monitor q4h  Denzil HughesKelsey D Tasmin Exantus, MD 2:16 PM

## 2018-04-17 NOTE — Progress Notes (Signed)
Labor Progress Note Kerry Ryan is a 32 y.o. G1P0000 at 3470w0d presented for IOL for cHTN with SIP vs worsening cHTN currently on labetalol  S: Called to room for fetal HR decelerations  O:  BP (!) 137/101   Pulse 76   Temp 98.2 F (36.8 C) (Oral)   Resp 18   Ht 4\' 10"  (1.473 m)   Wt 108.2 kg   LMP 08/01/2017   SpO2 100%   BMI 49.85 kg/m  EFM: 120/min to mod/no accels, + variable decels with increasingly deep nadir now touching 60 and slower return to baseline  CVE: Dilation: 5 Effacement (%): 80 Cervical Position: Middle Station: -2 Presentation: Vertex Exam by:: Dr Alvester MorinNewton   A&P: 32 y.o. G1P0000 6470w0d here for IOL #Labor: Transitioning to active labor.  Not on augmentation. Will give terbutalie #Pain: epidural  #FWB: Cat II-- reassuring but concerning give progression for deeper variables with slower return to baseline.  #GBS positive  Federico FlakeKimberly Niles Shaindel Sweeten, MD 5:17 PM

## 2018-04-17 NOTE — Anesthesia Pain Management Evaluation Note (Signed)
  CRNA Pain Management Visit Note  Patient: Kerry Ryan, 32 y.o., female  "Hello [facility]. We have an anesthesia team available at all times to provide care throughout the hospital, including epidural management and anesthesia for C-section. I don't know your plan for the delivery whether it a natural birth, water birth, IV sedation, nitrous supplementation, doula or epidural, but we want to meet your pain goals."   1.Was your pain managed to your expectations on prior hospitalizations?   No prior hospitalizations  2.What is your expectation for pain management during this hospitalization?     Epidural, IV pain meds and Nitrous Oxide  3.How can we help you reach that goal? Be available  Record the patient's initial score and the patient's pain goal.   Pain: 1  Pain Goal: 5 [facility] wants you to be able to say your pain was always managed very well.  [facility] HospitalMERRITT,Kerry Lunt 04/17/2018

## 2018-04-17 NOTE — Progress Notes (Signed)
Patient ID: Kerry Ryan, female   DOB: 1986-07-31, 32 y.o.   MRN: [id number] Has been experiencing excessive pain and frequent contractions Crying and writhing in pain  FHR nonreactive but without decels Unable to trace uterine activity  Vitals:   04/17/18 0421 04/17/18 0430 04/17/18 0528 04/17/18 0632  BP: 140/70  (!) 141/71 131/69  Pulse: 76  69 68  Resp: 16  16   Temp:  98.3 F (36.8 C)    TempSrc:  Axillary    Weight:      Height:       Terbutaline given to alleviate some of the contractions Fentanyl given Dr Adrian BlackwaterStinson consulted who came to see patient Abdomen soft but tender to palpation  Will get bedside US to rule out abruption. Will get epidural early

## 2018-04-17 NOTE — Op Note (Signed)
Cesarean Section Operative Report  PATIENT: Kerry Ryan  PROCEDURE DATE: 04/17/2018  PREOPERATIVE DIAGNOSES: Intrauterine pregnancy at [redacted]w[redacted]d weeks gestation; non-reassuring fetal status  POSTOPERATIVE DIAGNOSES: The same  PROCEDURE: Primary Low Transverse Cesarean Section  SURGEON:   Surgeon(s) and Role:    Tilda Burrow, MD - Primary - Attending   Marcy Siren, DO- OB Fellow   INDICATIONS: Kerry Ryan is a 32 y.o. G1P1001 at [redacted]w[redacted]d here for cesarean section secondary to the indications listed under preoperative diagnoses; please see preoperative note for further details.  The risks of cesarean section were discussed with the patient including but were not limited to: bleeding which may require transfusion or reoperation; infection which may require antibiotics; injury to bowel, bladder, ureters or other surrounding organs; injury to the fetus; need for additional procedures including hysterectomy in the event of a life-threatening hemorrhage; placental abnormalities wth subsequent pregnancies, incisional problems, thromboembolic phenomenon and other postoperative/anesthesia complications.   The patient concurred with the proposed plan, giving informed written consent for the procedure.    FINDINGS:  Viable female infant in cephalic presentation.  Apgars 3 and 5.  Clear amniotic fluid.  Intact placenta, three vessel cord.  Normal uterus, fallopian tubes and ovaries bilaterally.  ANESTHESIA: Epidural INTRAVENOUS FLUIDS: 1400   ESTIMATED BLOOD LOSS: 530 mL URINE OUTPUT:  1850 ml SPECIMENS: Placenta sent to L&D COMPLICATIONS: None immediate  PROCEDURE IN DETAIL:  The patient preoperatively received intravenous antibiotics and had sequential compression devices applied to her lower extremities.  She was then taken to the operating room where the epidural anesthesia was dosed up to surgical level and was found to be adequate. She was then placed in a dorsal supine position  with a leftward tilt, and prepped and draped in a sterile manner.  A foley catheter was placed into her bladder and attached to constant gravity.    After an adequate timeout was performed, a Pfannenstiel skin incision was made with scalpel and carried through to the underlying layer of fascia. The fascia was incised in the midline, and this incision was extended bilaterally using the Mayo scissors.  Kocher clamps were applied to the superior aspect of the fascial incision and the underlying rectus muscles were dissected off bluntly.  A similar process was carried out on the inferior aspect of the fascial incision. The rectus muscles were separated in the midline bluntly and the peritoneum was entered bluntly. Attention was turned to the lower uterine segment where a low transverse hysterotomy was made with a scalpel and extended bilaterally bluntly. Moderate terminal meconium was present at time of delivery. The infant was successfully delivered, the cord was clamped and cut, and the infant was handed over to the awaiting neonatology team. Uterine massage was then administered, and the placenta delivered intact with a three-vessel cord with no discoloration present. Cord gas was obtained.The uterus was then cleared of clots and debris.  The hysterotomy was closed with 0 Vicryl in a running locked fashion, and an imbricating layer was also placed with 0 Vicryl.   The pelvis was cleared of all clot and debris. Hemostasis was confirmed on all surfaces.  The peritoneum was closed with a 0 Vicryl running stitch. The fascia was then closed using 0 Vicryl in a running fashion.  The subcutaneous layer was irrigated, then reapproximated with 2-0 plain gut interrupted stitches. The skin was closed with a 4-0 Vicryl subcuticular stitch.   The patient tolerated the procedure well. Sponge, lap, instrument and needle counts  were correct x 3.  She was taken to the recovery room in stable condition.   An experienced  assistant was required given the standard of surgical care given the complexity of the case.  This assistant was needed for exposure, dissection, suctioning, retraction, instrument exchange, assisting with delivery with administration of fundal pressure, and for overall help during the procedure.   Maternal Disposition: PACU - hemodynamically stable.   Infant Disposition: To NICU    Marcy Sirenatherine Shaquan Puerta, D.O. OB Fellow  04/17/2018, 6:51 PM

## 2018-04-17 NOTE — Progress Notes (Addendum)
Labor Progress Note [name] is a 32 y.o. G1P0000 at 7570w0d presented for IOL due to chronic HTN.  S:   O:  BP (!) 149/85   Pulse 77   Temp 98.5 F (36.9 C) (Oral)   Resp 18   Ht 4\' 10"  (1.473 m)   Wt 108.2 kg   LMP 08/01/2017   SpO2 100%   BMI 49.85 kg/m  EFM: 130bpm/moderate variability/occasional decelerations with return to baseline.   CVE: Dilation: Fingertip Effacement (%): Thick Cervical Position: Posterior Station: -3 Presentation: Vertex Exam by:: Wallace MD   A&P: 32 y.o. G1P0000 970w0d IOL due to chronic HTN. #Labor: Progressing well. Foley balloon was placed.  #Pain: epidural #FWB: Category 2 #GBS positive, prophylactic treatment with PCN Chronic problems: HTN- BP remains elevated, sbp in 140s-150s continue labetalol 200 TID  Charyl DancerErin Horst, Student-PA 11:49 AM

## 2018-04-17 NOTE — Progress Notes (Signed)
Patient ID: Kerry Ryan, female   DOB: Jun 27, 1986, 32 y.o.   MRN: [id number] Received cytotec at 0227  Vitals:   04/17/18 0105 04/17/18 0107 04/17/18 0212  BP:  (!) 143/106 (!) 156/91  Pulse:  75 78  Resp: 18  18  Temp: 98.2 F (36.8 C)    TempSrc: Oral    Weight: 108.2 kg    Height: 4\' 10"  (1.473 m)     FHR now with repetetive late decelerations UCs every 2-3 min  Dilation: Fingertip Effacement (%): Thick Cervical Position: Posterior Presentation: Vertex(by U/S) Exam by:: Wynelle BourgeoisMarie Farhana Fellows, CNM  Consulted Dr Adrian BlackwaterStinson He recommends position changes and LR fluid bolus

## 2018-04-17 NOTE — H&P (Signed)
OBSTETRIC ADMISSION HISTORY AND PHYSICAL  Kerry Ryan is a 32 y.o. female G1P0000 with IUP at 2352w0d by LMP presenting for IOL for uncontrolled CHTN and GDM. Yesterday 9/17, BP was elevated 2x in office (143/103 and 150/110) and she was scheduled for induction at midnight today. She reports +FMs, No LOF, no VB. She is not having contractions.  BP has been uncontrolled during pregnancy despite repeated increases in labetalol. Currently taking 200mg  TID, last took meds yesterday evening (9/17) before coming to hospital .   Patient reports through her pregnancy, she has continued to have recurring headaches, bilateral LE edema, and sharp, pulling bilateral upper quadrant abd pain "under rib cage." In the past week, she has noted worsened non painful swelling of her hands and feet. She had floaters this AM (9/18), that has since resolved.    She plans on breast feeding. She is undecided for birth control. [facility]   Dating: By LMP --->  Estimated Date of Delivery: 05/08/18  Sono:   @35w  and 6d, CWD, normal anatomy, cephalic presentation, 2477 g, 20% EFW   Prenatal History/Complications: - Uncontrolled CHTN  - GDMA2, controlled with metformin - GERD - Hx LEEP   Past Medical History: Past Medical History:  Diagnosis Date  . Acne   . Anxiety   . Chlamydia infection   . Depression   . GERD (gastroesophageal reflux disease)   . Vaginal Pap smear, abnormal     Past Surgical History: Past Surgical History:  Procedure Laterality Date  . COLPOSCOPY    . LEEP      Obstetrical History: OB History    Gravida  1   Para  0   Term  0   Preterm  0   AB  0   Living  0     SAB  0   TAB  0   Ectopic  0   Multiple  0   Live Births  0           Social History: Social History   Socioeconomic History  . Marital status: Married    Spouse name: Not on file  . Number of children: Not on file  . Years of education: Not on file   . Highest education level: Not on file  Occupational History  . Not on file  Social Needs  . Financial resource strain: Not on file  . Food insecurity:    Worry: Not on file    Inability: Not on file  . Transportation needs:    Medical: Not on file    Non-medical: Not on file  Tobacco Use  . Smoking status: Never Smoker  . Smokeless tobacco: Never Used  Substance and Sexual Activity  . Alcohol use: No  . Drug use: No  . Sexual activity: Yes    Birth control/protection: None  Lifestyle  . Physical activity:    Days per week: Not on file    Minutes per session: Not on file  . Stress: Not on file  Relationships  . Social connections:    Talks on phone: Not on file    Gets together: Not on file    Attends religious service: Not on file    Active member of club or organization: Not on file    Attends meetings of clubs or organizations: Not on file    Relationship status: Not on file  Other Topics Concern  . Not on file  Social History Narrative  .  Not on file    Family History: Family History  Problem Relation Age of Onset  . Hypertension Mother   . Sarcoidosis Mother   . Cancer Paternal Grandmother        breast  . Other Paternal Grandmother        gangrene  . Other Maternal Grandmother        has a pacemaker  . Irritable bowel syndrome Maternal Grandmother   . Kidney failure Maternal Grandfather   . Other Maternal Grandfather        back problems    Allergies: No Known Allergies  Medications Prior to Admission  Medication Sig Dispense Refill Last Dose  . aspirin EC 81 MG tablet Take 2 tablets (162 mg total) by mouth daily. 60 tablet 6 04/16/2018 at Unknown time  . ferrous sulfate 325 (65 FE) MG tablet Take 1 tablet (325 mg total) by mouth 2 (two) times daily with a meal. 60 tablet 3 04/16/2018 at Unknown time  . fexofenadine (ALLEGRA) 180 MG tablet Take 180 mg by mouth daily.   04/16/2018 at Unknown time  . labetalol (NORMODYNE) 200 MG tablet Take 1 tablet  (200 mg total) by mouth 3 (three) times daily. 90 tablet 3 04/16/2018 at Unknown time  . metFORMIN (GLUCOPHAGE) 500 MG tablet Take 1 tablet (500 mg total) by mouth daily with supper. 30 tablet 3 04/16/2018 at Unknown time  . omeprazole (PRILOSEC) 20 MG capsule Take 1 capsule (20 mg total) by mouth daily. 30 capsule 5 04/16/2018 at Unknown time  . Prenatal MV & Min w/FA-DHA (PRENATAL ADULT GUMMY/DHA/FA PO) Take by mouth.   04/16/2018 at Unknown time  . terconazole (TERAZOL 7) 0.4 % vaginal cream Place 1 applicator vaginally at bedtime. 45 g 0 Past Week at Unknown time  . promethazine (PHENERGAN) 25 MG tablet Take 1 tablet (25 mg total) by mouth every 6 (six) hours as needed for nausea or vomiting. (Patient not taking: Reported on 04/12/2018) 30 tablet 1 More than a month at Unknown time     Review of Systems   All systems reviewed and negative except as stated in HPI  Blood pressure (!) 143/106, pulse 75, temperature 98.2 F (36.8 C), temperature source Oral, resp. rate 18, height 4\' 10"  (1.473 m), weight 108.2 kg, last menstrual period 08/01/2017. General appearance: alert, cooperative, moderately obese and uncomfortable appearance Lungs: clear to auscultation bilaterally Heart: regular rate and rhythm Abdomen: soft, non-tender; bowel sounds normal Pelvic: see below Extremities: 1+ of hands bilaterally, 2+ edema of calf, ankle, and feet bilaterally. Homans sign is negative, no sign of DVT DTR's: 2+ brachial bilaterally Presentation: cephalic Fetal monitoringBaseline: 135 bpm, Variability: Good {> 6 bpm), Accelerations: Reactive and Decelerations: Absent Uterine activity: irritability, not regular      Prenatal labs: ABO, Rh: O/Positive/-- (03/14 1602) Antibody: Negative (07/10 0844) Rubella: 1.03 (03/14 1602) RPR: Non Reactive (07/10 0844)  HBsAg: Negative (03/14 1602)  HIV: Non Reactive (07/10 0844)  GBS: Positive (03/14 0000) Positive  2 hour GT: Fasting 86/1h 169/2h 155  (02/06/18) Genetic screening  Normal Anatomy US: Normal female  Prenatal Transfer Tool  Maternal Diabetes: Yes:  Diabetes Type:  Insulin/Medication controlled Genetic Screening: Normal Maternal Ultrasounds/Referrals: Normal Fetal Ultrasounds or other Referrals:  None Maternal Substance Abuse:  No Significant Maternal Medications:  Meds include: Other: Metformin, labetalol, ASA, omeprazole Significant Maternal Lab Results: Lab values include: Group B Strep positive  Results for orders placed or performed in visit on 04/16/18 (from the past 24 hour(s))  POC Urinalysis Dipstick OB   Collection Time: 04/16/18 11:05 AM  Result Value Ref Range   Color, UA     Clarity, UA     Glucose, UA Negative Negative   Bilirubin, UA     Ketones, UA neg    Spec Grav, UA     Blood, UA neg    pH, UA     POC Protein UA Negative Negative, Trace   Urobilinogen, UA     Nitrite, UA neg    Leukocytes, UA Negative Negative   Appearance     Odor      Patient Active Problem List   Diagnosis Date Noted  . Gestational diabetes mellitus, class A2 02/07/2018  . GBS bacteriuria 10/17/2017  . Supervision of high risk pregnancy, antepartum 10/11/2017  . Chronic hypertension 10/11/2017  . History of loop electrosurgical excision procedure (LEEP) of cervix affecting pregnancy, antepartum 09/18/2017  . CIN II (cervical intraepithelial neoplasia II) 09/08/2015  . Morbid obesity (HCC) 01/29/2013  . Depression with anxiety 01/29/2013    Assessment/Plan:  Azriel A Henard is a 32 y.o. G1P0000 at [redacted]w[redacted]d here for IOL due to uncontrolled CHTN and GDM2A  #Labor: Will begin induction with pitocin q4h. #Pain: Declines epidural at this time. #FWB: Cat I #ID:  GBS-positive, penicillin started #MOF: Plans to breast feed #MOC: Undecided #CHTN: no BP in severe ranges. home labetalol, hydralazine PRN    Myrtie Hawk, Medical Student  04/17/2018, 1:37 AM

## 2018-04-17 NOTE — Anesthesia Preprocedure Evaluation (Addendum)
Anesthesia Evaluation  Patient identified by MRN, date of birth, ID band Patient awake    Reviewed: Allergy & Precautions, NPO status , Patient's Chart, lab work & pertinent test results, reviewed documented beta blocker date and time   Airway Mallampati: III  TM Distance: >3 FB Neck ROM: Full    Dental no notable dental hx. (+) Teeth Intact   Pulmonary neg pulmonary ROS,    Pulmonary exam normal breath sounds clear to auscultation       Cardiovascular hypertension, Pt. on medications and Pt. on home beta blockers Normal cardiovascular exam Rhythm:Regular Rate:Normal     Neuro/Psych PSYCHIATRIC DISORDERS Anxiety Depression negative neurological ROS     GI/Hepatic Neg liver ROS, GERD  Medicated and Controlled,  Endo/Other  diabetes, Well Controlled, GestationalMorbid obesity  Renal/GU negative Renal ROS  negative genitourinary   Musculoskeletal negative musculoskeletal ROS (+)   Abdominal (+) + obese,   Peds  Hematology  (+) anemia ,   Anesthesia Other Findings   Reproductive/Obstetrics (+) Pregnancy                             Anesthesia Physical Anesthesia Plan  ASA: III  Anesthesia Plan: Epidural   Post-op Pain Management:    Induction:   PONV Risk Score and Plan: Treatment may vary due to age or medical condition  Airway Management Planned: Natural Airway  Additional Equipment:   Intra-op Plan:   Post-operative Plan:   Informed Consent: I have reviewed the patients History and Physical, chart, labs and discussed the procedure including the risks, benefits and alternatives for the proposed anesthesia with the patient or authorized representative who has indicated his/her understanding and acceptance.     Plan Discussed with: CRNA and Surgeon  Anesthesia Plan Comments: (Patient for urgent C/Section for fetal intolerance to labor. Will use epidural for C/section. M.  Malen GauzeFoster, MD\)     Anesthesia Quick Evaluation

## 2018-04-18 ENCOUNTER — Encounter (HOSPITAL_COMMUNITY): Payer: Self-pay | Admitting: *Deleted

## 2018-04-18 ENCOUNTER — Telehealth: Payer: Self-pay | Admitting: *Deleted

## 2018-04-18 LAB — CBC
HEMATOCRIT: 23.5 % — AB (ref 36.0–46.0)
HEMOGLOBIN: 7.6 g/dL — AB (ref 12.0–15.0)
MCH: 29.5 pg (ref 26.0–34.0)
MCHC: 32.3 g/dL (ref 30.0–36.0)
MCV: 91.1 fL (ref 78.0–100.0)
Platelets: 172 10*3/uL (ref 150–400)
RBC: 2.58 MIL/uL — ABNORMAL LOW (ref 3.87–5.11)
RDW: 15.5 % (ref 11.5–15.5)
WBC: 8.9 10*3/uL (ref 4.0–10.5)

## 2018-04-18 LAB — GLUCOSE, CAPILLARY: Glucose-Capillary: 73 mg/dL (ref 70–99)

## 2018-04-18 LAB — CREATININE, SERUM
Creatinine, Ser: 0.55 mg/dL (ref 0.44–1.00)
GFR calc non Af Amer: >60 mL/min (ref >60)

## 2018-04-18 LAB — ABO/RH: ABO/RH(D): O POS

## 2018-04-18 NOTE — Addendum Note (Signed)
Addendum  created 04/18/18 1058 by Algis GreenhouseBurger, Malajah Oceguera A, CRNA   Sign clinical note

## 2018-04-18 NOTE — Progress Notes (Signed)
Post Partum Day #1 Subjective: Pt is a 32 yo G1P1001 55102w0d s/p pLTCS for NRFHT after IOL for cHTN and GDM. PPH w/ EBL of 1105 mL. Pt today reports moderate to severe lower abdominal pain near the incision site. Given Tylenol, Percocet planned after pt eats breakfast. Having some nausea, hasn't eaten yet since c/s. Urinating normally, +flatus, no BM yet. Lochia medium. Had concerning symptoms of intractable HA and upper abdominal pain at admission, but these are resolved today. Denies any vomiting, dizziness, HA, blurred vision, or RUQ pain. She is breastfeeding, currently undecided on MOC.   Objective: Blood pressure 115/68, pulse 75, temperature 97.8 F (36.6 C), temperature source Oral, resp. rate 15, height 4\' 10"  (1.473 m), weight 108.2 kg, SpO2 98 %, unknown if currently breastfeeding.  Physical Exam:  General: alert, cooperative, appears stated age, moderate distress and morbidly obese Lochia: appropriate Uterine Fundus: firm Incision: healing well, no significant drainage DVT Evaluation: Calf/ankle edema is present bilaterally. No erythema, negative Homans sign  Recent Labs    04/17/18 0201 04/17/18 1944  HGB 11.2* 10.9*  HCT 33.8* 33.0*   CBG: 73 (04/17/18 @ 1911)  PCR Pending  Assessment/Plan: Elevated BP readings overnight w/ max SBP of 146 and max DBP of 98. Most recent BP reading was 115/68 @ 0613 today. Continue nifedipine and monitor. Continue to monitor CBG. Plan for discharge tomorrow. Breastfeeding. F/u OP for contraception.    LOS: 1 day   Dillard'sCandice Colen Eltzroth 04/18/2018, 9:15 AM

## 2018-04-18 NOTE — Lactation Note (Addendum)
This note was copied from a baby's chart. Lactation Consultation Note  Patient Name: Kerry Ryan CallerCarina Bishop Today's Date: 04/18/2018   P1, Baby 22 hours old and in NICU. Mother has pumped once in the last 24 hours. Reminded her to pump q 2.5-3 hours and assisted mother w/ hand expression and pumping. Drops expressed.  Reviewed milk storage, cleaning, labels and NICU booklet. Encouraged hands on pumping. Parents have ordered insurance DEBP.  Discussed rental in gift shop if needed.  Mom made aware of O/P services, breastfeeding support groups, community resources, and our phone # for post-discharge questions.        Maternal Data    Feeding Feeding Type: Formula Nipple Type: Slow - flow Length of feed: 5 min  LATCH Score                   Interventions    Lactation Tools Discussed/Used     Consult Status      Hardie PulleyBerkelhammer, Ruth Boschen 04/18/2018, 4:57 PM

## 2018-04-18 NOTE — Anesthesia Postprocedure Evaluation (Signed)
Anesthesia Post Note  Patient: Kerry Ryan  Procedure(s) Performed: CESAREAN SECTION (N/A Abdomen)     Patient location during evaluation: Mother Baby Anesthesia Type: Epidural Level of consciousness: awake Pain management: satisfactory to patient Vital Signs Assessment: post-procedure vital signs reviewed and stable Respiratory status: spontaneous breathing Cardiovascular status: stable Anesthetic complications: no    Last Vitals:  Vitals:   04/18/18 0613 04/18/18 1000  BP: 115/68 121/74  Pulse: 75 74  Resp: 15 20  Temp: 36.6 C 36.6 C  SpO2: 98% 97%    Last Pain:  Vitals:   04/18/18 1000  TempSrc: Oral  PainSc: 2    Pain Goal: Patients Stated Pain Goal: 5 (04/18/18 0854)               Cephus ShellingBURGER,[name]

## 2018-04-18 NOTE — Telephone Encounter (Signed)
Lmom for pt to call us back to schedule incision check and pp appointment.  04-18-18  AS

## 2018-04-19 ENCOUNTER — Other Ambulatory Visit: Payer: BLUE CROSS/BLUE SHIELD | Admitting: Women's Health

## 2018-04-19 ENCOUNTER — Telehealth: Payer: Self-pay | Admitting: *Deleted

## 2018-04-19 NOTE — Lactation Note (Signed)
This note was copied from a baby's chart. Lactation Consultation Note  Patient Name: Kerry Murlean CallerCarina Palermo RUEAV'WToday's Date: 04/19/2018 Reason for consult: Follow-up assessment;NICU baby Mom is pumping small amounts of colostrum every 3 hours.  Discussed milk coming to volume.  Mom denies questions or concerns.  Discussed possible pump rental if her pump has not arrived.  Encouraged to call out for assist/concerns prn.  Maternal Data    Feeding Feeding Type: Formula Nipple Type: Slow - flow Length of feed: 10 min  LATCH Score                   Interventions    Lactation Tools Discussed/Used     Consult Status Consult Status: Follow-up Date: 04/20/18 Follow-up type: In-patient    Huston FoleyMOULDEN, Destyni Hoppel S 04/19/2018, 2:33 PM

## 2018-04-19 NOTE — Progress Notes (Addendum)
POSTPARTUM PROGRESS NOTE  Post Partum Day 2 Subjective:  Kerry Ryan is a 32 y.o. G1P1001 53w0ds/p pLTCS for NRFHT.  No acute events overnight.  Pt denies problems with ambulating, voiding or po intake.  She denies nausea or vomiting.  Pain is moderately controlled.  She has had flatus. She has not had bowel movement.  Lochia Moderate.   No lightheadedness or tachycardia with ambulation.  Objective: Blood pressure 125/71, pulse 81, temperature 98 F (36.7 C), temperature source Oral, resp. rate 16, height '4\' 10"'  (1.473 m), weight 108.2 kg, last menstrual period 08/01/2017, SpO2 98 %, unknown if currently breastfeeding.  Physical Exam:  General: alert, cooperative and no distress Lochia:normal flow Chest: CTAB Heart: RRR no m/r/g Abdomen: +BS, soft, nontender,  Uterine Fundus: firm DVT Evaluation: No calf swelling or tenderness Extremities: no edema  Recent Labs    04/17/18 1944 04/18/18 1358  HGB 10.9* 7.6*  HCT 33.0* 23.5*    Assessment/Plan:  ASSESSMENT: Kerry Ryan is a 32y.o. G1P1001 [redacted]w[redacted]d/p pLTCS for NRFHT.  #MOF: breast #MOC: POP possibly #DC: discharge 9/21 (baby in NICU) #cHTN:continue procardia 30 XR, babyscripts    LOS: 2 days   PeMatilde HaymakerMD 04/19/2018, 9:14 AM   I personally saw and evaluated the patient, performing the key elements of the service. I developed and verified the management plan that is described in the resident's/student's note, and I agree with the content with my edits above. VSS, HRR&R, Resp unlabored, Legs neg.   Discussed Babyscripts postpartum HTN surveillance program and she agrees to participate. Aware that Discharge RN will help her w/the details.   FrNigel BertholdCNM 04/23/2018 5:21 PM

## 2018-04-19 NOTE — Progress Notes (Signed)
CSW received consult due to score 15 on Edinburgh Depression Screen.    CSW met with MOB via bedside- mother was pleasant but tearful due to baby being admitted to the NICU. MOB voiced concerns with her being potentially discharged tomorrow with baby continuing to stay in the NICU. CSW empathized with MOB's feelings and informed her she is able to visit baby in NICU as frequently as possible. MOB voiced no other concerns at this time and stated she is only feeling anxious due to baby being in the NICU.   CSW provided education regarding Baby Blues vs PMADs and provided MOB with resources for mental health follow up.  CSW encouraged MOB to evaluate her mental health throughout the postpartum period with the use of the New Mom Checklist developed by Postpartum [employer] as well as the [place] Postnatal Depression Scale and notify a medical professional if symptoms arise.    Please reconsult for any future needs.   Kingsley Spittle, [place]  [phone]

## 2018-04-19 NOTE — Telephone Encounter (Signed)
Lmom for pt to call us back to schedule incision check.  04-19-18  AS

## 2018-04-20 DIAGNOSIS — O10919 Unspecified pre-existing hypertension complicating pregnancy, unspecified trimester: Secondary | ICD-10-CM | POA: Diagnosis present

## 2018-04-20 DIAGNOSIS — Z98891 History of uterine scar from previous surgery: Secondary | ICD-10-CM

## 2018-04-20 LAB — GLUCOSE, CAPILLARY: GLUCOSE-CAPILLARY: 93 mg/dL (ref 70–99)

## 2018-04-20 MED ORDER — OXYCODONE HCL 5 MG PO TABS: 5.0000 mg | ORAL_TABLET | ORAL | 0 refills | Status: DC | PRN

## 2018-04-20 MED ORDER — NIFEDIPINE ER 60 MG PO TB24: 60.0000 mg | ORAL_TABLET | Freq: Every day | ORAL | 1 refills | Status: DC

## 2018-04-20 NOTE — Discharge Summary (Signed)
OB Discharge Summary     Patient Name: Mitchell HeirCarina A Broyles DOB: 11-Dec-1985 MRN: [id number]  Date of admission: 04/17/2018 Delivering MD: Tilda BurrowFERGUSON, JOHN V   Date of discharge: 04/20/2018  Admitting diagnosis: 37 wk direct admit Intrauterine pregnancy: 3858w0d     Secondary diagnosis:  Principal Problem:   S/P primary low transverse C-section Active Problems:   Morbid obesity (HCC)   Depression with anxiety   CIN II (cervical intraepithelial neoplasia II)   History of loop electrosurgical excision procedure (LEEP) of cervix affecting pregnancy, antepartum   Gestational diabetes mellitus, class A2   Failed induction of labor, delivered   Chronic hypertension affecting pregnancy  Additional problems: none     Discharge diagnosis: Term Pregnancy Delivered, CHTN and GDM A2                                                                                                Post partum procedures:none  Augmentation: none  Complications: None  Hospital course:  Induction of Labor With Cesarean Section  32 y.o. yo G1P1001 at 658w0d was admitted to the hospital 04/17/2018 for induction of labor. Patient had a labor course significant for induction of labor for uncontrolled cHTN and A2GDM, cytotec x 2 with terbutaline after tachysystole, category 2 FHT throughout induction, then run of late decels into 60s treated with terbutaline. The patient went for cesarean section due to Non-Reassuring FHR, and delivered a Viable infant,04/17/2018  Membrane Rupture Time/Date: 2:07 PM ,04/17/2018   Details of operation can be found in separate operative Note.  Patient had an uncomplicated postpartum course. She is ambulating, tolerating a regular diet, passing flatus, and urinating well.  Patient is discharged home in stable condition on 04/20/18.                                   For her cHTN, she was changed from labetalol to procardia. For her A2gdm, her metformin was stopped and she will need a 2 hr GTT at her 6  week postpartum visit.   Physical exam  Vitals:   04/19/18 1408 04/19/18 2208 04/19/18 2237 04/20/18 0638  BP: (!) 135/92 (!) 163/113 (!) 142/87 (!) 145/86  Pulse: 92 (!) 108 92 81  Resp: 17 18  18   Temp: 98.4 F (36.9 C) 98.2 F (36.8 C)  98.6 F (37 C)  TempSrc: Oral Oral  Oral  SpO2:  99%    Weight:      Height:       General: alert, cooperative and no distress Lochia: appropriate Uterine Fundus: firm Incision: Healing well with no significant drainage, No significant erythema, Dressing is clean, dry, and intact DVT Evaluation: No evidence of DVT seen on physical exam. Labs: Lab Results  Component Value Date   WBC 8.9 04/18/2018   HGB 7.6 (L) 04/18/2018   HCT 23.5 (L) 04/18/2018   MCV 91.1 04/18/2018   PLT 172 04/18/2018   CMP Latest Ref Rng & Units 04/18/2018  Glucose 70 - 99 mg/dL -  BUN 6 - 20  mg/dL -  Creatinine 1.61 - 0.96 mg/dL 0.45  Sodium 409 - 811 mmol/L -  Potassium 3.5 - 5.1 mmol/L -  Chloride 98 - 111 mmol/L -  CO2 22 - 32 mmol/L -  Calcium 8.9 - 10.3 mg/dL -  Total Protein 6.5 - 8.1 g/dL -  Total Bilirubin 0.3 - 1.2 mg/dL -  Alkaline Phos 38 - 914 U/L -  AST 15 - 41 U/L -  ALT 0 - 44 U/L -    Discharge instruction: per After Visit Summary and "Baby and Me Booklet".  After visit meds:  Allergies as of 04/20/2018   No Known Allergies     Medication List    STOP taking these medications   aspirin EC 81 MG tablet   labetalol 200 MG tablet Commonly known as:  NORMODYNE   metFORMIN 500 MG tablet Commonly known as:  GLUCOPHAGE   promethazine 25 MG tablet Commonly known as:  PHENERGAN   terconazole 0.4 % vaginal cream Commonly known as:  TERAZOL 7     TAKE these medications   ferrous sulfate 325 (65 FE) MG tablet Take 1 tablet (325 mg total) by mouth 2 (two) times daily with a meal.   fexofenadine 180 MG tablet Commonly known as:  ALLEGRA Take 180 mg by mouth daily.   NIFEdipine 60 MG 24 hr tablet Commonly known as:   PROCARDIA-XL/ADALAT CC Take 1 tablet (60 mg total) by mouth daily.   omeprazole 20 MG capsule Commonly known as:  PRILOSEC Take 1 capsule (20 mg total) by mouth daily.   oxyCODONE 5 MG immediate release tablet Commonly known as:  Oxy IR/ROXICODONE Take 1 tablet (5 mg total) by mouth every 4 (four) hours as needed (pain scale 4-7).   PRENATAL ADULT GUMMY/DHA/FA PO Take by mouth.       Diet: routine diet  Activity: Advance as tolerated. Pelvic rest for 6 weeks.   Outpatient follow up:1 week for BP check and 6 weeks Follow up Appt: Future Appointments  Date Time Provider Department Center  04/23/2018 11:30 AM [facility] NURSE TECH [facility]  04/30/2018 12:00 PM Lazaro Arms, MD [facility]  05/24/2018 12:00 PM Cheral Marker, CNM [facility]   Follow up Visit:No follow-ups on file.  Postpartum contraception: Undecided  Newborn Data: Live born female  Birth Weight: 4 lb 2.7 oz (1890 g) APGAR: 3, 5  Newborn Delivery   Birth date/time:  04/17/2018 18:01:00 Delivery type:  C-Section, Low Transverse Trial of labor:  Yes C-section categorization:  Primary     Baby Feeding: Breast Disposition:NICU   04/20/2018 Ted Mcalpine, MD

## 2018-04-20 NOTE — Progress Notes (Signed)
Patient complains of numbness in right lower extremity. Pedal pulses palpable, weaker in right. 2+ pedal edema noted. Joellyn HaffKim Booker CNM notified. Provider to round on patient. Gracious Renken D. Shellee MiloMobley, RN 04/20/2018 6:51 AM

## 2018-04-20 NOTE — Lactation Note (Signed)
This note was copied from a baby's chart. Lactation Consultation Note  Patient Name: Kerry Ryan'UToday's Date: 04/20/2018 Reason for consult: Follow-up assessment;NICU baby Mom is pumping every 3 hours and obtaining small amounts of colostrum.  [facility]  Encouraged to call for assist/concerns prn. Maternal Data    Feeding Feeding Type: Formula Nipple Type: Slow - flow Length of feed: 10 min  LATCH Score                   Interventions    Lactation Tools Discussed/Used     Consult Status Consult Status: PRN    Huston FoleyMOULDEN, Felipa Laroche S 04/20/2018, 10:23 AM

## 2018-04-22 ENCOUNTER — Ambulatory Visit: Payer: Self-pay

## 2018-04-22 NOTE — Lactation Note (Signed)
This note was copied from a baby's chart. Lactation Consultation Note  Patient Name: Kerry Ryan CallerCarina Ciaravino Today's Date: 04/22/2018   P1 mother whose infant is now 425 days old.  Baby was transferred from the NICU and is a baby patient.  Mother has been discharged.  RN requested latch assistance  This is a LPTI at 37+0 weeks who weighed 4+2.7 oz at birth  FOB had fed 10 mls of mother's EBM via nipple when I arrived.  Mother was in the bathroom pumping due to visitors being in the room.  I offered to assist with latching and mother accepted.  This was the first time mother was able to put baby to breast.  I explained to mother about the corrected gestational age and the attempt to breast feed.  She understands that latching will take patience and practice.  Mother's breasts are large, soft and non tender.  Her nipples are everted but short shafted.  Her tissue is very compressible.  Assisted to latch in the football hold on the right breast.  Baby had a wide open mouth and latched easily.  She immediately took 3-4 sucks and became agitated at the breast.  She released from the breast and I assisted to latch again with the same results.  We continued this practice for a few repeated attempts and she continued to latch and suck well for 3-4 sucks before becoming agitated and self releasing.  Attempted to burp without success.  Tried the cross cradle hold on the same breast with the same results.  I reassured mother that I was pleased to see she opened her mouth wide and was able to latch and suck a few times.  Mother stated she felt the tugging and denied pain.    I talked with mother about the importance of letting baby practice without stressing her out too much or allowing her to burn extra calories.  After these attempts I used the curved tip syringe to supplement an additional 30 mls of mother's EBM.  Baby was able to effectively suck and pull the EBM from the syringe without difficulty.  She  burped well between the syringes and finished with ease.  I showed mother the signs that baby displayed when she was full and no longer interested in feeding/sucking any longer.  Mother will be aware of baby's cues and ask for assistance as needed.  Also explained that mother needs to attempt breast feeding before supplementing.  Baby may not be able to breast feed every time and mother may put her to breast every other feeding depending upon the alertness of the baby.    Encouraged mother to call her RN/LC for latch assistance as needed.  She will watch for feeding cues and do hand expression before and after feeding.  I gave her the handbook and pointed out the milk storage guidelines for her.  Also discussed the importance of not using the bathroom as a pumping room and to be diligent about cleanliness with pump parts and hand hygiene.  RN updated.   Maternal Data    Feeding Feeding Type: Formula Nipple Type: Slow - flow Length of feed: 20 min  LATCH Score                   Interventions    Lactation Tools Discussed/Used     Consult Status      Asante Ritacco R Nels Munn 04/22/2018, 6:48 PM

## 2018-04-23 ENCOUNTER — Ambulatory Visit (INDEPENDENT_AMBULATORY_CARE_PROVIDER_SITE_OTHER): Payer: BLUE CROSS/BLUE SHIELD | Admitting: *Deleted

## 2018-04-23 ENCOUNTER — Encounter: Payer: Self-pay | Admitting: *Deleted

## 2018-04-23 VITALS — BP 143/94 | HR 110 | Wt 222.0 lb

## 2018-04-23 DIAGNOSIS — Z0130 Encounter for examination of blood pressure without abnormal findings: Secondary | ICD-10-CM

## 2018-04-24 ENCOUNTER — Telehealth: Payer: Self-pay | Admitting: *Deleted

## 2018-04-24 NOTE — Telephone Encounter (Signed)
Hgb 10.6 reported from [place] with [facility].

## 2018-04-30 ENCOUNTER — Encounter: Payer: Self-pay | Admitting: Obstetrics & Gynecology

## 2018-04-30 ENCOUNTER — Other Ambulatory Visit: Payer: Self-pay

## 2018-04-30 ENCOUNTER — Ambulatory Visit (INDEPENDENT_AMBULATORY_CARE_PROVIDER_SITE_OTHER): Payer: BLUE CROSS/BLUE SHIELD | Admitting: Obstetrics & Gynecology

## 2018-04-30 VITALS — BP 142/96 | HR 90 | Ht <= 58 in | Wt 219.0 lb

## 2018-04-30 DIAGNOSIS — Z9889 Other specified postprocedural states: Secondary | ICD-10-CM | POA: Diagnosis not present

## 2018-04-30 NOTE — Progress Notes (Signed)
  HPI: Patient returns for routine postoperative follow-up having undergone primary C section  on 04/17/2018.  The patient's immediate postoperative recovery has been unremarkable. Since hospital discharge the patient reports no problems.   Current Outpatient Medications: ferrous sulfate 325 (65 FE) MG tablet, Take 1 tablet (325 mg total) by mouth 2 (two) times daily with a meal., Disp: 60 tablet, Rfl: 3 fexofenadine (ALLEGRA) 180 MG tablet, Take 180 mg by mouth daily., Disp: , Rfl:  NIFEdipine (PROCARDIA-XL/ADALAT CC) 60 MG 24 hr tablet, Take 1 tablet (60 mg total) by mouth daily., Disp: 30 tablet, Rfl: 1 omeprazole (PRILOSEC) 20 MG capsule, Take 1 capsule (20 mg total) by mouth daily., Disp: 30 capsule, Rfl: 5 Prenatal MV & Min w/FA-DHA (PRENATAL ADULT GUMMY/DHA/FA PO), Take by mouth., Disp: , Rfl:  oxyCODONE (OXY IR/ROXICODONE) 5 MG immediate release tablet, Take 1 tablet (5 mg total) by mouth every 4 (four) hours as needed (pain scale 4-7). (Patient not taking: Reported on 04/23/2018), Disp: 30 tablet, Rfl: 0  No current facility-administered medications for this visit.     Blood pressure (!) 142/96, pulse 90, height 4\' 10"  (1.473 m), weight 219 lb (99.3 kg), currently breastfeeding.  Physical Exam: Incision clean dry intact Dressing and steri strips removed  Diagnostic Tests:   Pathology:   Impression: S/p emergency C section for FITL  Plan: Routine post op care  Follow up: 5  weeks pp visit BCM: considering depo provera  Lazaro Arms, MD

## 2018-05-01 ENCOUNTER — Inpatient Hospital Stay (HOSPITAL_COMMUNITY): Admission: RE | Admit: 2018-05-01 | Payer: BLUE CROSS/BLUE SHIELD | Source: Ambulatory Visit

## 2018-05-24 ENCOUNTER — Encounter: Payer: Self-pay | Admitting: Women's Health

## 2018-05-24 ENCOUNTER — Ambulatory Visit (INDEPENDENT_AMBULATORY_CARE_PROVIDER_SITE_OTHER): Payer: BLUE CROSS/BLUE SHIELD | Admitting: Women's Health

## 2018-05-24 DIAGNOSIS — Z8632 Personal history of gestational diabetes: Secondary | ICD-10-CM

## 2018-05-24 DIAGNOSIS — I10 Essential (primary) hypertension: Secondary | ICD-10-CM

## 2018-05-24 DIAGNOSIS — Z8759 Personal history of other complications of pregnancy, childbirth and the puerperium: Secondary | ICD-10-CM | POA: Insufficient documentation

## 2018-05-24 DIAGNOSIS — Z3202 Encounter for pregnancy test, result negative: Secondary | ICD-10-CM

## 2018-05-24 DIAGNOSIS — N871 Moderate cervical dysplasia: Secondary | ICD-10-CM

## 2018-05-24 DIAGNOSIS — Z98891 History of uterine scar from previous surgery: Secondary | ICD-10-CM | POA: Diagnosis not present

## 2018-05-24 DIAGNOSIS — Z029 Encounter for administrative examinations, unspecified: Secondary | ICD-10-CM

## 2018-05-24 LAB — POCT URINE PREGNANCY: Preg Test, Ur: NEGATIVE

## 2018-05-24 MED ORDER — NIFEDIPINE ER OSMOTIC RELEASE 30 MG PO TB24: 30.0000 mg | ORAL_TABLET | Freq: Two times a day (BID) | ORAL | 11 refills | Status: AC

## 2018-05-24 MED ORDER — MEDROXYPROGESTERONE ACETATE 150 MG/ML IM SUSP: 150.0000 mg | INTRAMUSCULAR | 3 refills | Status: DC

## 2018-05-24 NOTE — Progress Notes (Signed)
POSTPARTUM VISIT Patient name: Kerry Ryan MRN [id number]  Date of birth: Feb 14, 1986 Chief Complaint:   Postpartum Care (wants bc( shot))  History of Present Illness:   Kerry Ryan is a 32 y.o. G104P1001 Caucasian female being seen today for a postpartum visit. She is 5 weeks postpartum following a primary cesarean section, low transverse incision d/t NRFHR at 37.0 gestational weeks after IOL for [facility] and A2DM. Anesthesia: epidural. I have fully reviewed the prenatal and intrapartum course. D/C'd home on procardia 60mg  XL daily, takes in AM. Notices she wakes up with headaches, so not sure if maybe BP is high in am before she takes meds. Pregnancy complicated by [facility] on Labetalol (dx at new ob visit), A2DM, h/o LEEP, dep/anx no meds. Postpartum course has been uncomplicated. Bleeding thin lochia. Bowel function is normal. Bladder function is normal.  Patient is not sexually active. Last sexual activity: prior to birth of baby.  Contraception method is wants depo.  [place] Postpartum Depression Screening: equivocal. Score 10. Feels fine, just overwhelmed at times. Denies SI/HI/II. Does have h/o dep/anx, was on meds in past, does not feel like she needs to restart at this time. Discussed depo can potentially worsen dep/anx, if so let us know   Last pap 02/06/18.  Results were normal .  No LMP recorded.  Baby's course has been complicated by FGR 4lb2.7oz (1890g) at birth; NICU stay. Baby is feeding by breast & bottle.  Review of Systems:   Pertinent items are noted in HPI Denies Abnormal vaginal discharge w/ itching/odor/irritation, headaches, visual changes, shortness of breath, chest pain, abdominal pain, severe nausea/vomiting, or problems with urination or bowel movements. Pertinent History Reviewed:  Reviewed past medical,surgical, obstetrical and family history.  Reviewed problem list, medications and allergies. OB History  Gravida Para Term Preterm AB Living  1 1 1  0 0 1  SAB  TAB Ectopic Multiple Live Births  0 0 0 0 1    # Outcome Date GA Lbr Len/2nd Weight Sex Delivery Anes PTL Lv  1 Term 04/17/18 [redacted]w[redacted]d  4 lb 2.7 oz (1.89 kg) F CS-LTranv EPI  LIV   Physical Assessment:   Vitals:   05/24/18 1216  BP: 100/70  Pulse: (!) 104  Weight: 219 lb 3.2 oz (99.4 kg)  Height: 4\' 10"  (1.473 m)  Body mass index is 45.81 kg/m.       Physical Examination:   General appearance: alert, well appearing, and in no distress  Mental status: alert, oriented to person, place, and time  Skin: warm & dry   Cardiovascular: normal heart rate noted   Respiratory: normal respiratory effort, no distress   Breasts: deferred, no complaints   Abdomen: soft, non-tender, c/s incision well-healed   Pelvic: VULVA: normal appearing vulva with no masses, tenderness or lesions, UTERUS: uterus is normal size, shape, consistency and nontender  Rectal: no hemorrhoids  Extremities: no edema       Results for orders placed or performed in visit on 05/24/18 (from the past 24 hour(s))  POCT urine pregnancy   Collection Time: 05/24/18 12:40 PM  Result Value Ref Range   Preg Test, Ur Negative Negative    Assessment & Plan:  1) Postpartum exam 2) 5 wks s/p PLTCS for NRFHR during IOL for CHTN, A2DM 3) Breast & bottlefeeding 4) Depression screening 5) Contraception counseling, pt prefers depo, rx sent, condoms x 2wks  6) CHTN> change procardia to 30mg  XL BID (instead of 60mg XL daily) d/t headaches in AM and  bp on low side today, new rx sent, will bring back Monday to check bp (when comes for depo) to make sure bp still well controlled on new doseage 7) H/O A2DM> 2hr GTT @ 12wks  Meds:  Meds ordered this encounter  Medications  . NIFEdipine (PROCARDIA-XL/NIFEDICAL-XL) 30 MG 24 hr tablet    Sig: Take 1 tablet (30 mg total) by mouth 2 (two) times daily.    Dispense:  60 tablet    Refill:  11    Order Specific Question:   Supervising Provider    Answer:   Despina Hidden, LUTHER H [[id number]]  .  medroxyPROGESTERone (DEPO-PROVERA) 150 MG/ML injection    Sig: Inject 1 mL (150 mg total) into the muscle every 3 (three) months.    Dispense:  1 mL    Refill:  3    Order Specific Question:   Supervising Provider    Answer:   Lazaro Arms [[id number]]    Follow-up: Return for Monday for bp check/depo, then 7wks from now for sugar test (no visit)).   Orders Placed This Encounter  Procedures  . POCT urine pregnancy    Cheral Marker CNM, [facility] 05/24/2018 1:38 PM

## 2018-05-24 NOTE — Patient Instructions (Signed)
Tips To Increase Milk Supply  Lots of water! Enough so that your urine is clear  Plenty of calories, if you're not getting enough calories, your milk supply can decrease  Breastfeed/pump often, every 2-3 hours x 20-30mins  Fenugreek 3 pills 3 times a day, this may make your urine smell like maple syrup  Mother's Milk Tea  Lactation cookies, google for the recipe  Real oatmeal   

## 2018-05-27 ENCOUNTER — Ambulatory Visit (INDEPENDENT_AMBULATORY_CARE_PROVIDER_SITE_OTHER): Payer: BLUE CROSS/BLUE SHIELD

## 2018-05-27 VITALS — BP 122/81 | HR 75 | Ht <= 58 in | Wt 219.0 lb

## 2018-05-27 DIAGNOSIS — Z0130 Encounter for examination of blood pressure without abnormal findings: Secondary | ICD-10-CM

## 2018-05-27 DIAGNOSIS — Z3042 Encounter for surveillance of injectable contraceptive: Secondary | ICD-10-CM

## 2018-05-27 MED ORDER — MEDROXYPROGESTERONE ACETATE 150 MG/ML IM SUSP
150.0000 mg | Freq: Once | INTRAMUSCULAR | Status: AC
  Administered 2018-05-27: 150 mg via INTRAMUSCULAR

## 2018-05-27 NOTE — Progress Notes (Signed)
Pt here for blood pressure/ depo injection. B/P 122/81 pulse 75.pregnancy test done 10/25 negative.depo injection 150 mg IM given lt deltoid.tolerated well. Return 12 weeks for next injection.Spoke kim booker about blood pressure.advised keep doing what said.Pt understand. Pad CMA

## 2018-06-18 ENCOUNTER — Encounter: Payer: Self-pay | Admitting: *Deleted

## 2018-07-11 ENCOUNTER — Encounter: Payer: Self-pay | Admitting: Advanced Practice Midwife

## 2018-07-11 ENCOUNTER — Other Ambulatory Visit: Payer: BLUE CROSS/BLUE SHIELD

## 2018-07-11 ENCOUNTER — Ambulatory Visit (INDEPENDENT_AMBULATORY_CARE_PROVIDER_SITE_OTHER): Payer: BLUE CROSS/BLUE SHIELD | Admitting: Advanced Practice Midwife

## 2018-07-11 ENCOUNTER — Other Ambulatory Visit: Payer: Self-pay | Admitting: Obstetrics & Gynecology

## 2018-07-11 VITALS — BP 146/95 | HR 77 | Ht <= 58 in | Wt 224.0 lb

## 2018-07-11 DIAGNOSIS — N921 Excessive and frequent menstruation with irregular cycle: Secondary | ICD-10-CM

## 2018-07-11 DIAGNOSIS — Z8632 Personal history of gestational diabetes: Secondary | ICD-10-CM

## 2018-07-11 MED ORDER — NORGESTIMATE-ETH ESTRADIOL 0.25-35 MG-MCG PO TABS: 1.0000 | ORAL_TABLET | Freq: Every day | ORAL | 11 refills | Status: DC

## 2018-07-11 MED ORDER — MEGESTROL ACETATE 40 MG PO TABS: ORAL_TABLET | ORAL | 3 refills | Status: DC

## 2018-07-11 NOTE — Progress Notes (Signed)
[facility] Clinic Visit  Patient name: Kerry Ryan MRN [id number]  Date of birth: [birth year]  CC & HPI:  Kerry Ryan is a 32 y.o. Caucasian female presenting today for birth control options. Got Depo 05/27/18 bled the whole time. Aware that this is a common SE, but wants to switch to COCs.  Wants to stop bleeding first  Pertinent History Reviewed:  Medical & Surgical Hx:   Past Medical History:  Diagnosis Date  . Acne   . Anxiety   . Chlamydia infection   . Depression   . GERD (gastroesophageal reflux disease)   . Vaginal Pap smear, abnormal    Past Surgical History:  Procedure Laterality Date  . CESAREAN SECTION N/A 04/17/2018   Procedure: CESAREAN SECTION;  Surgeon: Tilda Burrow, MD;  Location: [facility] BIRTHING SUITES;  Service: Obstetrics;  Laterality: N/A;  . COLPOSCOPY    . LEEP     Family History  Problem Relation Age of Onset  . Hypertension Mother   . Sarcoidosis Mother   . Cancer Paternal Grandmother        breast  . Other Paternal Grandmother        gangrene  . Other Maternal Grandmother        has a pacemaker  . Irritable bowel syndrome Maternal Grandmother   . Kidney failure Maternal Grandfather   . Other Maternal Grandfather        back problems    Current Outpatient Medications:  .  ferrous sulfate 325 (65 FE) MG tablet, Take 1 tablet (325 mg total) by mouth 2 (two) times daily with a meal., Disp: 60 tablet, Rfl: 3 .  fexofenadine (ALLEGRA) 180 MG tablet, Take 180 mg by mouth daily., Disp: , Rfl:  .  medroxyPROGESTERone (DEPO-PROVERA) 150 MG/ML injection, Inject 1 mL (150 mg total) into the muscle every 3 (three) months., Disp: 1 mL, Rfl: 3 .  NIFEdipine (PROCARDIA-XL/NIFEDICAL-XL) 30 MG 24 hr tablet, Take 1 tablet (30 mg total) by mouth 2 (two) times daily., Disp: 60 tablet, Rfl: 11 .  omeprazole (PRILOSEC) 20 MG capsule, Take 1 capsule (20 mg total) by mouth daily., Disp: 30 capsule, Rfl: 5 .  Prenatal MV & Min w/FA-DHA (PRENATAL ADULT  GUMMY/DHA/FA PO), Take by mouth., Disp: , Rfl:  .  megestrol (MEGACE) 40 MG tablet, Take 3/day (at the same time) for 5 days; 2/day for 5 days, then 1/day PO prn bleeding, Disp: 60 tablet, Rfl: 3 .  norgestimate-ethinyl estradiol (ORTHO-CYCLEN,SPRINTEC,PREVIFEM) 0.25-35 MG-MCG tablet, Take 1 tablet by mouth daily., Disp: 1 Package, Rfl: 11 .  oxyCODONE (OXY IR/ROXICODONE) 5 MG immediate release tablet, Take 1 tablet (5 mg total) by mouth every 4 (four) hours as needed (pain scale 4-7). (Patient not taking: Reported on 04/23/2018), Disp: 30 tablet, Rfl: 0 Social History: Reviewed -  reports that she has never smoked. She has never used smokeless tobacco.  Review of Systems:   Constitutional: Negative for fever and chills Eyes: Negative for visual disturbances Respiratory: Negative for shortness of breath, dyspnea Cardiovascular: Negative for chest pain or palpitations  Gastrointestinal: Negative for vomiting, diarrhea and constipation; no abdominal pain Genitourinary: Negative for dysuria and urgency, vaginal irritation or itching Musculoskeletal: Negative for back pain, joint pain, myalgias  Neurological: Negative for dizziness and headaches    Objective Findings:    Physical Examination: Vitals:   07/11/18 1111  BP: (!) 146/95  Pulse: 77   General appearance - well appearing, and in no distress Mental status -  alert, oriented to person, place, and time Chest:  Normal respiratory effort Heart - normal rate and regular rhythm Abdomen:  Soft, nontender Musculoskeletal:  Normal range of motion without pain Extremities:  No edema   Doing 2hr GTT today d/t A2DM in pg   No results found for this or any previous visit (from the past 24 hour(s)).    Assessment & Plan:  A:   BTB on depo P:  Megace algorhithm until bleeding stops, then start COCs   Return for If you have any problems.  Jacklyn ShellFrances Cresenzo-Dishmon CNM 07/11/2018 12:36 PM

## 2018-07-12 ENCOUNTER — Other Ambulatory Visit: Payer: BLUE CROSS/BLUE SHIELD

## 2018-07-12 LAB — GLUCOSE TOLERANCE, 2 HOURS W/ 1HR
GLUCOSE, FASTING: 99 mg/dL — AB (ref 65–91)
Glucose, 1 hour: 168 mg/dL (ref 65–179)
Glucose, 2 hour: 110 mg/dL (ref 65–152)

## 2018-08-19 ENCOUNTER — Ambulatory Visit: Payer: BLUE CROSS/BLUE SHIELD

## 2018-12-19 ENCOUNTER — Ambulatory Visit (INDEPENDENT_AMBULATORY_CARE_PROVIDER_SITE_OTHER): Payer: Managed Care, Other (non HMO) | Admitting: Family

## 2018-12-19 ENCOUNTER — Other Ambulatory Visit: Payer: Self-pay

## 2018-12-19 ENCOUNTER — Telehealth: Payer: Self-pay | Admitting: Obstetrics and Gynecology

## 2018-12-19 ENCOUNTER — Encounter: Payer: Self-pay | Admitting: Family

## 2018-12-19 DIAGNOSIS — B3731 Acute candidiasis of vulva and vagina: Secondary | ICD-10-CM

## 2018-12-19 DIAGNOSIS — B373 Candidiasis of vulva and vagina: Secondary | ICD-10-CM

## 2018-12-19 MED ORDER — FLUCONAZOLE 150 MG PO TABS: 150.0000 mg | ORAL_TABLET | ORAL | 0 refills | Status: AC | PRN

## 2018-12-19 MED ORDER — BUTOCONAZOLE NITRATE (1 DOSE) 2 % VA CREA: 5.0000 g | TOPICAL_CREAM | Freq: Every evening | VAGINAL | 1 refills | Status: AC

## 2018-12-19 NOTE — Progress Notes (Signed)
   Virtual Visit via telephone Note  I connected with Kerry Ryan on 12/19/18 at 3:29 pm by telephone and verified that I am speaking with the correct person using two identifiers. Kerry Ryan is currently located at home and no one is currently with her during visit. The provider, Jannifer Rodney, FNP is located in their office at time of visit.  I discussed the limitations, risks, security and privacy concerns of performing an evaluation and management service by telephone and the availability of in person appointments. I also discussed with the patient that there may be a patient responsible charge related to this service. The patient expressed understanding and agreed to proceed.   History and Present Illness:  Vaginal Discharge  The patient's primary symptoms include vaginal discharge. The patient's pertinent negatives include no genital itching, genital lesions, genital odor or vaginal bleeding. This is a new problem. The current episode started in the past 7 days. The problem occurs constantly. The problem has been gradually worsening. The pain is mild. Pertinent negatives include no back pain, chills, constipation, discolored urine, dysuria, frequency, hematuria or urgency. The vaginal discharge was white.      Review of Systems  Constitutional: Negative for chills.  Gastrointestinal: Negative for constipation.  Genitourinary: Positive for vaginal discharge. Negative for dysuria, frequency, hematuria and urgency.  Musculoskeletal: Negative for back pain.  All other systems reviewed and are negative.    Observations/Objective: No SOB or distress noted  Assessment and Plan: 1. Vagina, candidiasis Keep clean and dry Avoid irritants and harsh soaps Avoid scratching Call office if symptoms worsen or do not improve  - fluconazole (DIFLUCAN) 150 MG tablet; Take 1 tablet (150 mg total) by mouth every three (3) days as needed.  Dispense: 3 tablet; Refill: 0 - Butoconazole  Nitrate, 1 Dose, 2 % CREA; Place 5 g vaginally Nightly.  Dispense: 5 g; Refill: 1     I discussed the assessment and treatment plan with the patient. The patient was provided an opportunity to ask questions and all were answered. The patient agreed with the plan and demonstrated an understanding of the instructions.   The patient was advised to call back or seek an in-person evaluation if the symptoms worsen or if the condition fails to improve as anticipated.  The above assessment and management plan was discussed with the patient. The patient verbalized understanding of and has agreed to the management plan. Patient is aware to call the clinic if symptoms persist or worsen. Patient is aware when to return to the clinic for a follow-up visit. Patient educated on when it is appropriate to go to the emergency department.   Time call ended: 3:39 pm   I provided 10 minutes of non-face-to-face time during this encounter.    Jannifer Rodney, FNP

## 2018-12-19 NOTE — Telephone Encounter (Signed)
Patient called, she is bright red in her private area, hurting, she is not sure if due to soap or not.  Requesting an appointment.  [phone]

## 2018-12-19 NOTE — Telephone Encounter (Signed)
Spoke with pt. Pt is bright red on outer lips and on vagina. Irritation started a few days ago. Used a different soap and different toilet paper. I advised we don't have any appts available but to call PCP and see if she can get in there. If they can't see her, may have to go to Urgent Care. Pt voiced understanding. JSY

## 2019-08-25 IMAGING — US US MFM OB LIMITED
1 series · 15 of 19 positions shown · non-contrast
Comparison: none

[Series 1: us mfm ob limited · 19 acquisitions, 15 frames shown]
[im 1/19]
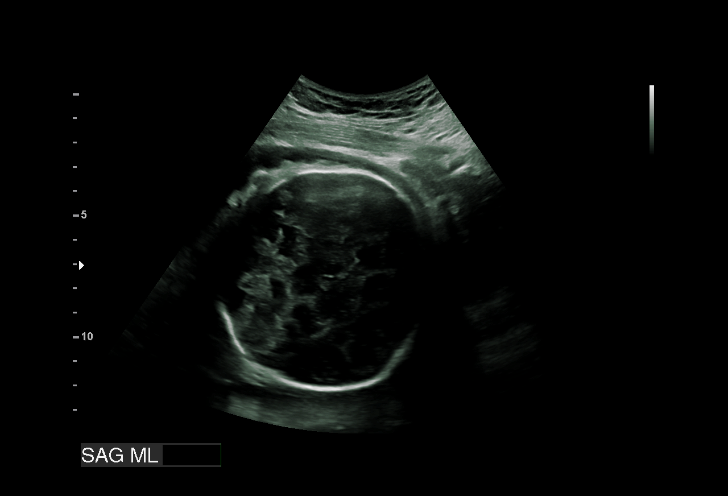
[im 2/19]
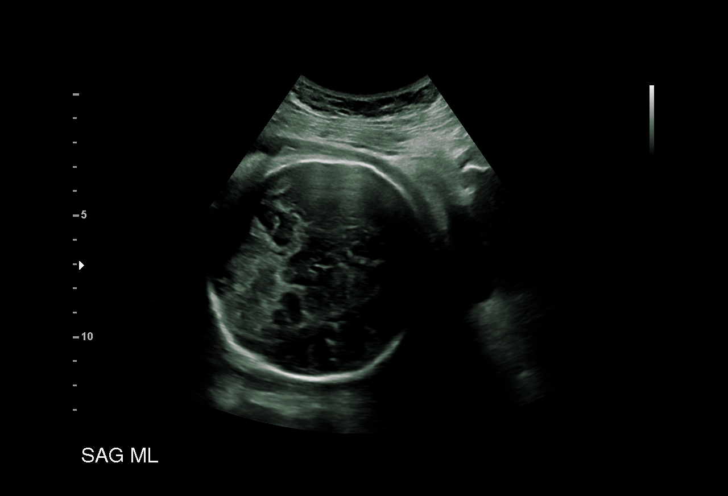
[im 4/19]
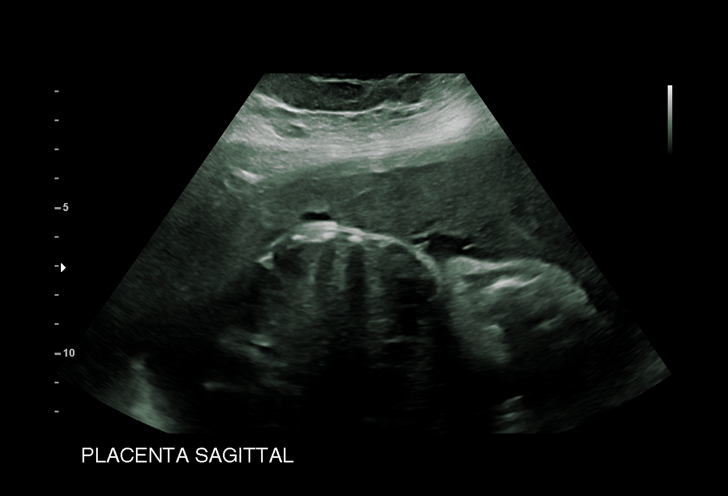
[im 5/19]
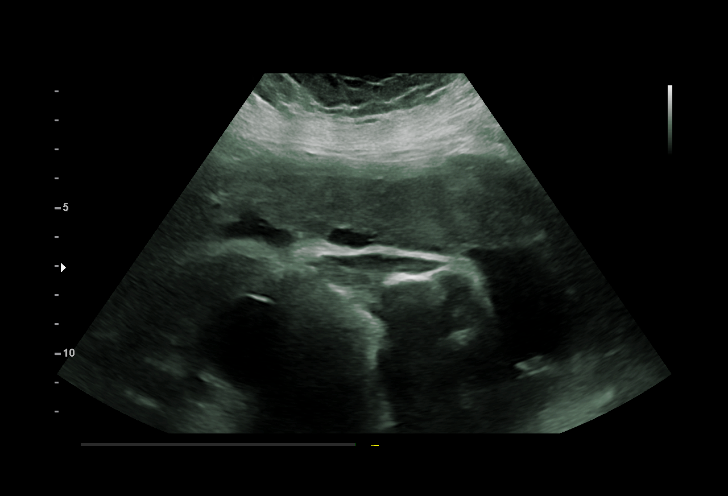
[im 6/19]
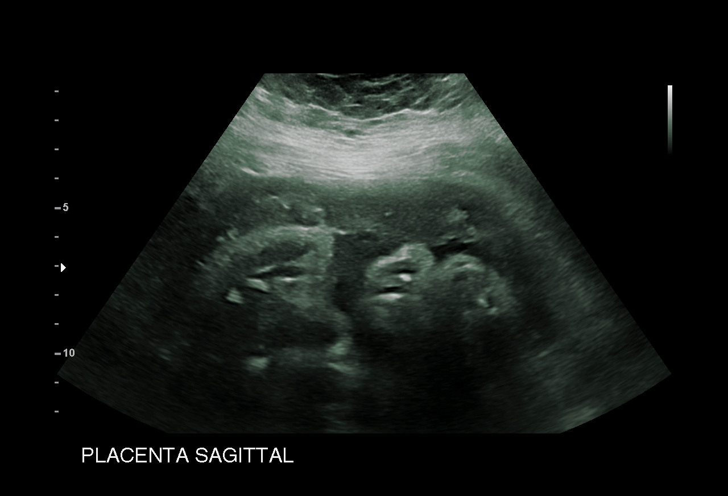
[im 7/19]
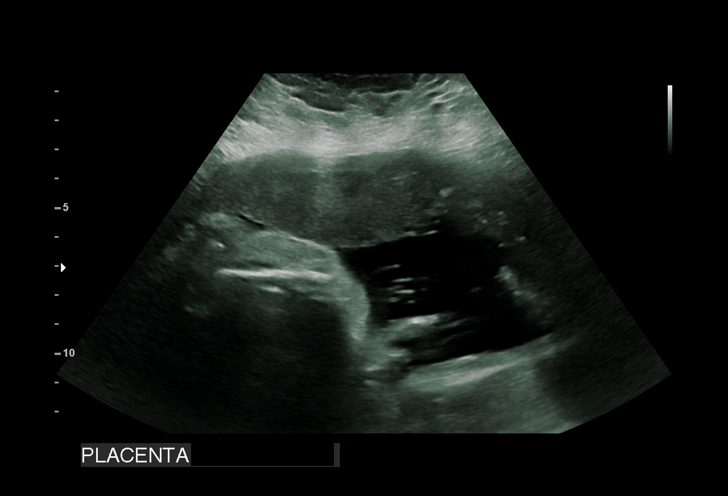
[im 9/19]
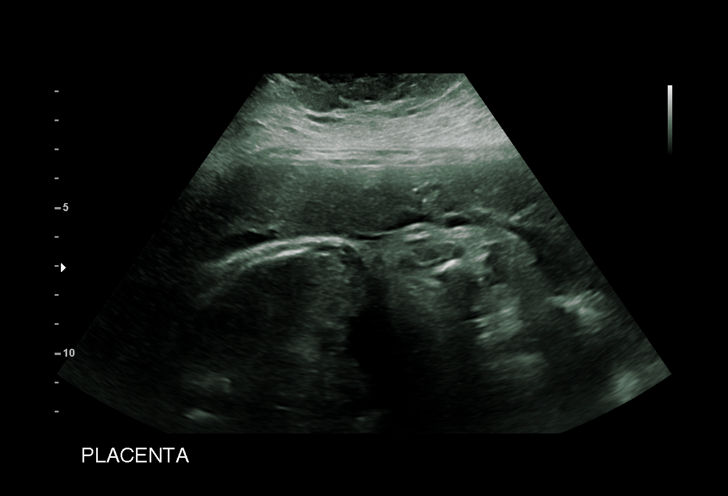
[im 10/19]
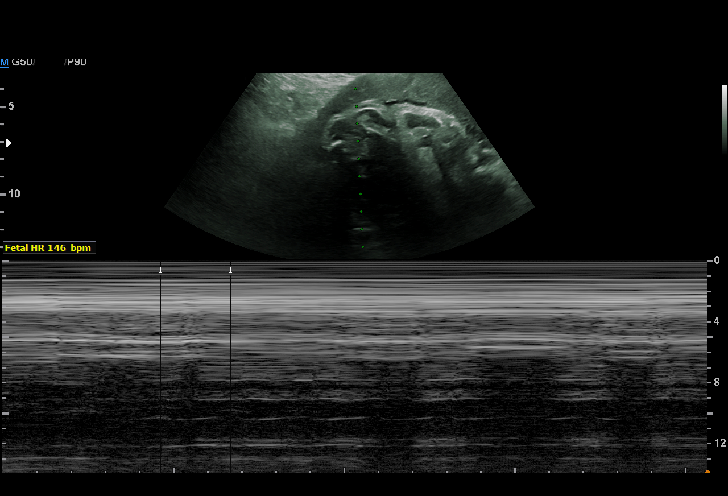
[im 11/19]
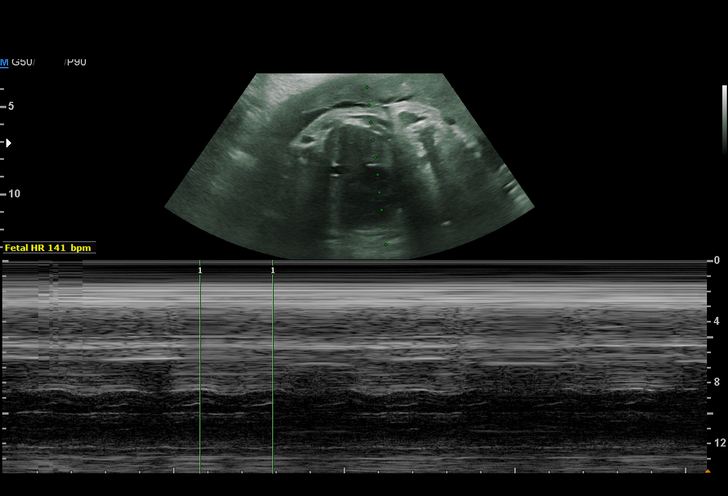
[im 13/19]
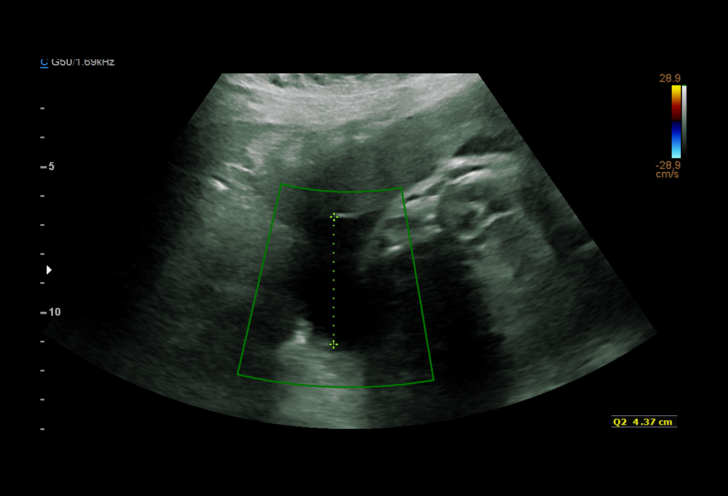
[im 14/19]
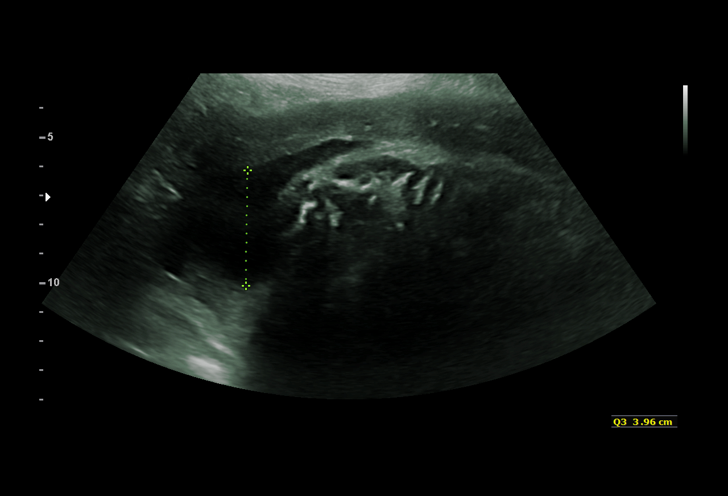
[im 15/19]
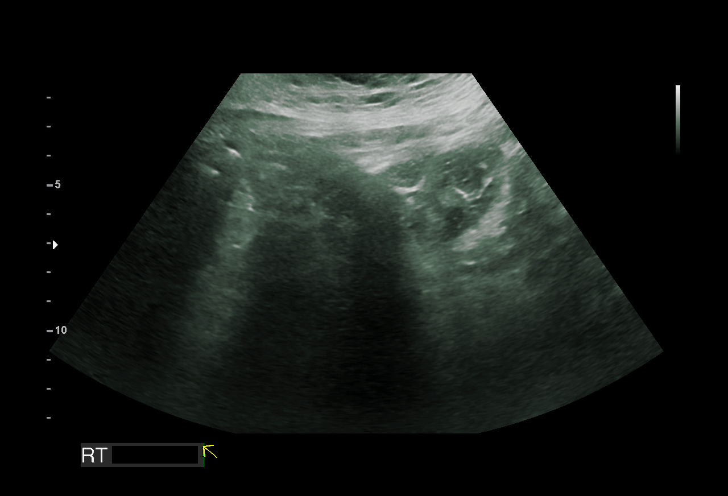
[im 16/19]
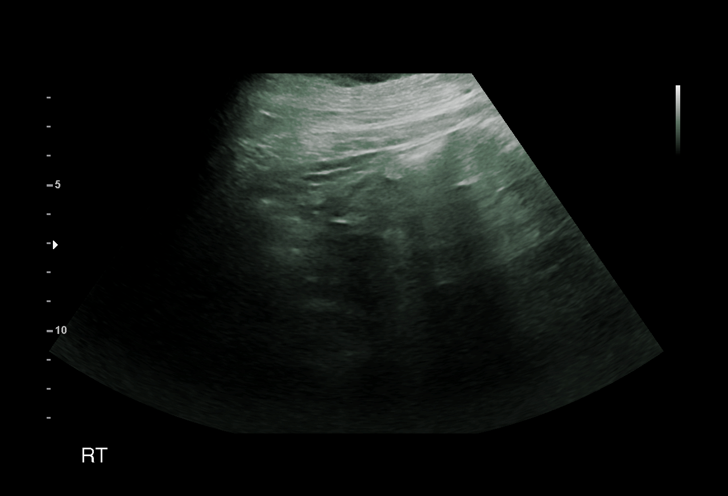
[im 18/19]
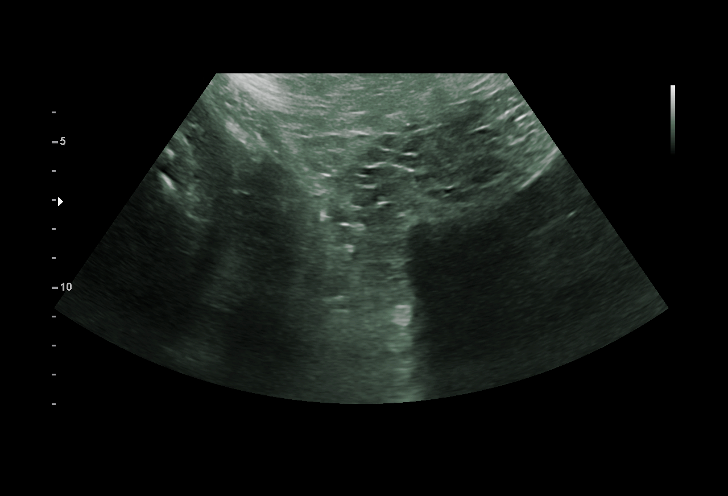
[im 19/19]
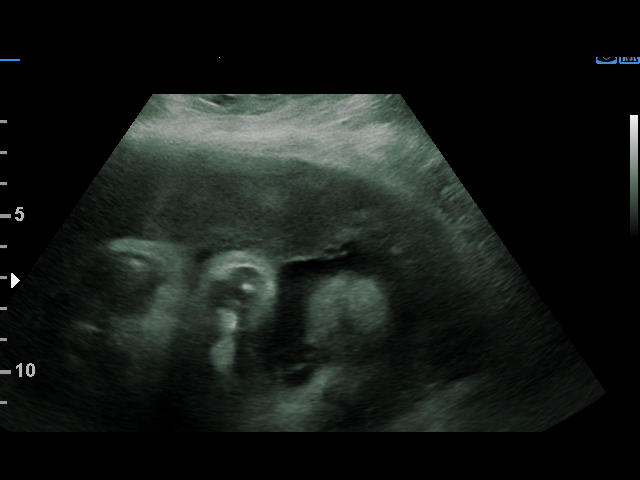

[15 of 19 positions shown; findings below may reference images not displayed]

Indications

Abdominal pain in pregnancy
37 weeks gestation of pregnancy
Obesity complicating pregnancy, third
trimester
Hypertension - Chronic/Pre-existing
Gestational diabetes in pregnancy,
unspecified control
Fetal Evaluation

Num Of Fetuses:         1
Fetal Heart Rate(bpm):  146
Cardiac Activity:       Observed
Presentation:           Cephalic
Placenta:               Anterior

Amniotic Fluid
AFI FV:      Subjectively low-normal

AFI Sum(cm)     %Tile       Largest Pocket(cm)
12.83           45

RUQ(cm)                     LUQ(cm)        LLQ(cm)
4.5

Comment:    No placental abruption or previa identified.
OB History

Gravidity:    1
Gestational Age

Clinical EDD:  37w 0d                                        EDD:   05/08/18
Best:          37w 0d     Det. By:  Clinical EDD             EDD:   05/08/18
Cervix Uterus Adnexa

Cervix
Not visualized (advanced GA >08wks)

Left Ovary
Not visualized.

Right Ovary
Not visualized.
Impression

Patient is admitted for induction of labor (chronic
hypertension).
A limited ultrasound study was performed. Amniotic fluid is
normal and good fetal activity is seen. Cephalic presentation.
